# Patient Record
Sex: Female | Born: 1935 | Race: White | Hispanic: No | Marital: Married | Smoking: Never smoker
Health system: Southern US, Community
[De-identification: ages and names within clinical notes are randomized; demographics above are authoritative.]

## PROBLEM LIST (undated history)

## (undated) DIAGNOSIS — S0990XA Unspecified injury of head, initial encounter: Secondary | ICD-10-CM

## (undated) DIAGNOSIS — I471 Supraventricular tachycardia: Secondary | ICD-10-CM

## (undated) DIAGNOSIS — R51 Headache: Secondary | ICD-10-CM

## (undated) DIAGNOSIS — M8580 Other specified disorders of bone density and structure, unspecified site: Secondary | ICD-10-CM

## (undated) DIAGNOSIS — K219 Gastro-esophageal reflux disease without esophagitis: Secondary | ICD-10-CM

## (undated) DIAGNOSIS — M47812 Spondylosis without myelopathy or radiculopathy, cervical region: Secondary | ICD-10-CM

## (undated) DIAGNOSIS — K5790 Diverticulosis of intestine, part unspecified, without perforation or abscess without bleeding: Secondary | ICD-10-CM

## (undated) DIAGNOSIS — C4491 Basal cell carcinoma of skin, unspecified: Secondary | ICD-10-CM

## (undated) HISTORY — DX: Unspecified injury of head, initial encounter: S09.90XA

## (undated) HISTORY — DX: Gastro-esophageal reflux disease without esophagitis: K21.9

## (undated) HISTORY — DX: Other specified disorders of bone density and structure, unspecified site: M85.80

## (undated) HISTORY — DX: Supraventricular tachycardia: I47.1

## (undated) HISTORY — PX: NISSEN FUNDOPLICATION: SHX2091

## (undated) HISTORY — DX: Spondylosis without myelopathy or radiculopathy, cervical region: M47.812

## (undated) HISTORY — DX: Basal cell carcinoma of skin, unspecified: C44.91

## (undated) HISTORY — PX: ABDOMINAL HYSTERECTOMY: SHX81

## (undated) HISTORY — PX: APPENDECTOMY: SHX54

## (undated) HISTORY — PX: COLONOSCOPY: SHX174

## (undated) HISTORY — DX: Headache: R51

## (undated) HISTORY — DX: Diverticulosis of intestine, part unspecified, without perforation or abscess without bleeding: K57.90

## (undated) HISTORY — PX: TONSILLECTOMY: SUR1361

---

## 2001-06-24 DIAGNOSIS — I471 Supraventricular tachycardia, unspecified: Secondary | ICD-10-CM

## 2001-06-24 HISTORY — DX: Supraventricular tachycardia, unspecified: I47.10

## 2001-06-24 HISTORY — DX: Supraventricular tachycardia: I47.1

## 2001-07-13 ENCOUNTER — Ambulatory Visit (HOSPITAL_COMMUNITY): Admission: RE | Admit: 2001-07-13 | Discharge: 2001-07-13 | Payer: Self-pay | Admitting: Internal Medicine

## 2001-07-13 ENCOUNTER — Encounter: Payer: Self-pay | Admitting: Internal Medicine

## 2001-09-12 ENCOUNTER — Inpatient Hospital Stay (HOSPITAL_COMMUNITY): Admission: EM | Admit: 2001-09-12 | Discharge: 2001-09-15 | Payer: Self-pay | Admitting: Cardiology

## 2001-09-12 ENCOUNTER — Encounter: Payer: Self-pay | Admitting: Emergency Medicine

## 2001-10-18 ENCOUNTER — Emergency Department (HOSPITAL_COMMUNITY): Admission: EM | Admit: 2001-10-18 | Discharge: 2001-10-18 | Payer: Self-pay | Admitting: Internal Medicine

## 2001-10-18 ENCOUNTER — Encounter: Payer: Self-pay | Admitting: Internal Medicine

## 2001-11-27 ENCOUNTER — Observation Stay (HOSPITAL_COMMUNITY): Admission: EM | Admit: 2001-11-27 | Discharge: 2001-11-28 | Payer: Self-pay | Admitting: Emergency Medicine

## 2001-11-27 ENCOUNTER — Encounter: Payer: Self-pay | Admitting: Emergency Medicine

## 2001-12-18 ENCOUNTER — Ambulatory Visit (HOSPITAL_COMMUNITY): Admission: RE | Admit: 2001-12-18 | Discharge: 2001-12-18 | Payer: Self-pay | Admitting: Internal Medicine

## 2002-05-05 ENCOUNTER — Ambulatory Visit (HOSPITAL_COMMUNITY): Admission: RE | Admit: 2002-05-05 | Discharge: 2002-05-05 | Payer: Self-pay | Admitting: Internal Medicine

## 2002-05-05 ENCOUNTER — Encounter: Payer: Self-pay | Admitting: Internal Medicine

## 2003-02-24 ENCOUNTER — Encounter: Payer: Self-pay | Admitting: Internal Medicine

## 2003-02-24 ENCOUNTER — Ambulatory Visit (HOSPITAL_COMMUNITY): Admission: RE | Admit: 2003-02-24 | Discharge: 2003-02-24 | Payer: Self-pay | Admitting: Internal Medicine

## 2003-06-27 ENCOUNTER — Emergency Department (HOSPITAL_COMMUNITY): Admission: EM | Admit: 2003-06-27 | Discharge: 2003-06-27 | Payer: Self-pay | Admitting: Emergency Medicine

## 2004-04-23 ENCOUNTER — Other Ambulatory Visit: Admission: RE | Admit: 2004-04-23 | Discharge: 2004-04-23 | Payer: Self-pay | Admitting: Dermatology

## 2004-04-28 ENCOUNTER — Inpatient Hospital Stay (HOSPITAL_COMMUNITY): Admission: EM | Admit: 2004-04-28 | Discharge: 2004-04-30 | Payer: Self-pay | Admitting: Emergency Medicine

## 2004-05-01 ENCOUNTER — Emergency Department (HOSPITAL_COMMUNITY): Admission: EM | Admit: 2004-05-01 | Discharge: 2004-05-01 | Payer: Self-pay | Admitting: Emergency Medicine

## 2004-05-03 ENCOUNTER — Ambulatory Visit (HOSPITAL_COMMUNITY): Admission: RE | Admit: 2004-05-03 | Discharge: 2004-05-03 | Payer: Self-pay | Admitting: General Surgery

## 2004-08-10 ENCOUNTER — Observation Stay (HOSPITAL_COMMUNITY): Admission: EM | Admit: 2004-08-10 | Discharge: 2004-08-11 | Payer: Self-pay | Admitting: Emergency Medicine

## 2004-08-10 ENCOUNTER — Ambulatory Visit: Payer: Self-pay | Admitting: *Deleted

## 2004-08-10 LAB — CONVERTED CEMR LAB
BUN: 14 mg/dL
CO2: 26 meq/L
Chloride: 100 meq/L
Glucose, Bld: 85 mg/dL
HCT: 34 %
Hemoglobin: 12 g/dL
Platelets: 218 10*3/uL
Potassium: 3.4 meq/L
Sodium: 134 meq/L
Total Protein: 5.9 g/dL
WBC: 5.8 10*3/uL

## 2004-08-15 ENCOUNTER — Ambulatory Visit (HOSPITAL_COMMUNITY): Admission: RE | Admit: 2004-08-15 | Discharge: 2004-08-15 | Payer: Self-pay | Admitting: Internal Medicine

## 2004-10-09 ENCOUNTER — Ambulatory Visit: Payer: Self-pay | Admitting: Internal Medicine

## 2004-10-09 ENCOUNTER — Ambulatory Visit (HOSPITAL_COMMUNITY): Admission: RE | Admit: 2004-10-09 | Discharge: 2004-10-09 | Payer: Self-pay | Admitting: Internal Medicine

## 2005-06-28 ENCOUNTER — Ambulatory Visit (HOSPITAL_COMMUNITY): Admission: RE | Admit: 2005-06-28 | Discharge: 2005-06-28 | Payer: Self-pay | Admitting: Internal Medicine

## 2005-08-19 ENCOUNTER — Ambulatory Visit (HOSPITAL_COMMUNITY): Admission: RE | Admit: 2005-08-19 | Discharge: 2005-08-19 | Payer: Self-pay | Admitting: Internal Medicine

## 2009-09-11 LAB — CONVERTED CEMR LAB
ALT: 13 units/L
AST: 17 units/L
Albumin: 4.4 g/dL
Alkaline Phosphatase: 67 units/L
BUN: 13 mg/dL
CO2: 28 meq/L
Calcium: 8.7 mg/dL
Chloride: 103 meq/L
Creatinine, Ser: 0.79 mg/dL
Glucose, Bld: 87 mg/dL
Potassium: 4.1 meq/L
Sodium: 141 meq/L
TSH: 1.04 microintl units/mL
Total Bilirubin: 0.6 mg/dL
Total Protein: 6.9 g/dL

## 2010-07-05 LAB — CONVERTED CEMR LAB
BUN: 17 mg/dL
CO2: 28 meq/L
Chloride: 103 meq/L
Creatinine, Ser: 0.81 mg/dL
Glucose, Bld: 99 mg/dL
Potassium: 3.8 meq/L
Sodium: 140 meq/L

## 2010-07-12 ENCOUNTER — Ambulatory Visit (HOSPITAL_COMMUNITY)
Admission: RE | Admit: 2010-07-12 | Discharge: 2010-07-12 | Payer: Self-pay | Source: Home / Self Care | Attending: Internal Medicine | Admitting: Internal Medicine

## 2010-07-14 ENCOUNTER — Emergency Department (HOSPITAL_COMMUNITY)
Admission: EM | Admit: 2010-07-14 | Discharge: 2010-07-14 | Payer: Self-pay | Source: Home / Self Care | Admitting: Emergency Medicine

## 2010-07-14 LAB — CONVERTED CEMR LAB
HCT: 38.5 %
Hemoglobin: 13.7 g/dL
Platelets: 230 10*3/uL
WBC: 7.3 10*3/uL

## 2010-07-16 ENCOUNTER — Encounter: Payer: Self-pay | Admitting: *Deleted

## 2010-07-17 LAB — DIFFERENTIAL
Basophils Absolute: 0 10*3/uL (ref 0.0–0.1)
Basophils Relative: 1 % (ref 0–1)
Eosinophils Absolute: 0.2 10*3/uL (ref 0.0–0.7)
Eosinophils Relative: 2 % (ref 0–5)
Lymphocytes Relative: 24 % (ref 12–46)
Lymphs Abs: 1.8 10*3/uL (ref 0.7–4.0)
Monocytes Absolute: 0.8 10*3/uL (ref 0.1–1.0)
Neutro Abs: 4.5 10*3/uL (ref 1.7–7.7)
Neutrophils Relative %: 62 % (ref 43–77)

## 2010-07-17 LAB — BASIC METABOLIC PANEL
BUN: 17 mg/dL (ref 6–23)
CO2: 28 mEq/L (ref 19–32)
Calcium: 9.5 mg/dL (ref 8.4–10.5)
Chloride: 103 mEq/L (ref 96–112)
Creatinine, Ser: 0.81 mg/dL (ref 0.4–1.2)
GFR calc Af Amer: >60 mL/min (ref >60)
GFR calc non Af Amer: >60 mL/min (ref >60)
Glucose, Bld: 99 mg/dL (ref 70–99)
Sodium: 140 mEq/L (ref 135–145)

## 2010-07-17 LAB — CBC
HCT: 38.5 % (ref 36.0–46.0)
Hemoglobin: 13.7 g/dL (ref 12.0–15.0)
WBC: 7.3 10*3/uL (ref 4.0–10.5)

## 2010-07-17 LAB — POCT CARDIAC MARKERS
CKMB, poc: 1.3 ng/mL (ref 1.0–8.0)
Myoglobin, poc: 43.4 ng/mL (ref 12–200)

## 2010-07-19 ENCOUNTER — Ambulatory Visit
Admission: RE | Admit: 2010-07-19 | Discharge: 2010-07-19 | Payer: Self-pay | Source: Home / Self Care | Attending: Cardiology | Admitting: Cardiology

## 2010-07-19 ENCOUNTER — Encounter (INDEPENDENT_AMBULATORY_CARE_PROVIDER_SITE_OTHER): Payer: Self-pay | Admitting: *Deleted

## 2010-07-19 DIAGNOSIS — R079 Chest pain, unspecified: Secondary | ICD-10-CM | POA: Insufficient documentation

## 2010-07-19 DIAGNOSIS — K219 Gastro-esophageal reflux disease without esophagitis: Secondary | ICD-10-CM | POA: Insufficient documentation

## 2010-07-26 NOTE — Letter (Signed)
Summary: Stress Echocardiogram Information Sheet  [facility] at [facility]  [address], [place] [address]   Phone: [phone]  Fax: [phone]      July 19, 2010 MRN: [id number] light prior to the test.   [name]  Doctor: Appointment Date: Appointment Time: Appointment Location: [facility]  Stress Echocardiogram Information Sheet    Instructions:   1. DO NOT  take your am medicine morning before the test.  2. Nothing to eat or drink after midnight  3. Dress prepared to exercise.  4. DO NOT use ANY caffine or tobacco products 3 hours before appointment.  5. Report to the Short Stay Center on the1st floor.  6. Please bring all current prescription medications.  7. If you have any questions, please call [phone]

## 2010-07-31 ENCOUNTER — Encounter (HOSPITAL_COMMUNITY): Payer: Self-pay

## 2010-07-31 ENCOUNTER — Ambulatory Visit (HOSPITAL_COMMUNITY): Payer: MEDICARE

## 2010-08-01 NOTE — Assessment & Plan Note (Signed)
Summary: ***ER DR.FAGAN FOR CHEST PAIN/JAW PAIN/NEW T WAVE CHANGES ON ...   Visit Type:  Initial Consult Primary Provider:  Dr. Carylon Perches   History of Present Illness: [name] is seen at the kind request of Dr. Ouida Sills for an initial visit for evaluation of chest discomfort.  This nice woman, diagnosed with right shoulder bursitis in the past, has noted less intense recurrent pain in recent weeks.  This has been associated with tightness in the right chest, but no dyspnea, diaphoresis or nausea.  There is no relationship to meals, movement, exertion or body position.  Mobility of the right shoulder remains fairly good and far better than during her previous episode.  Radiation of discomfort to the right neck and jaw has occurred intermittently.    Prior records were obtained from Dr. Ouida Sills and from [facility] and reviewed.  A CT scan of the cervical spine from a few years ago demonstrated substantial degenerative disease.  Patient has no history of tobacco use, diabetes, hypertension or hyperlipidemia.  There is no strong family history for coronary disease.  She has derived some benefit from use of over-the-counter nonsteroidals.  [name] first came to the attention of our practice 10 years ago when she presented with PSVT.  Radiofrequency ablation performed by Dr. Graciela Husbands has apparently been curative.  Cardiac catheterization performed in 2003 for chest discomfort associated with her supraventricular tachycardia revealed no abnormalities.  MRI Brain  Procedure date:  08/19/2005  Findings:       Findings:  Ventricles are normal in size.  Small chronic lacunar   infarctions are noted in the basal ganglia and right thalamus.  No   significant white matter infarcts are seen.  Diffusion weighted   imaging is negative for acute infarct.  Negative for mass or fluid   collection.   IMPRESSION:   Mild chronic ischemic changes are noted.  No acute abnormality.  CT Scan  Procedure  date:  08/15/2004  Findings:      CT of Cervical Spine   IMPRESSION:   Multilevel degenerative disc disease changes at C4-C5, C5-C6, and   C6-C7.   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   Right neural foraminal stenosis at C5-C6 without definite C6 root   compression.   No disc herniation at any level.   No definite cause for radicular symptoms to left upper extremity.   PFT  Procedure date:  08/15/2004  Findings:      No definite ventilatory defect other than mild small airway obstruction  EKG  Procedure date:  07/19/2010  Findings:      Normal sinus rhythm Within normal limits No significant change when compared to a previous tracing of 07/10/2008  -  Date:  07/14/2010    WBC: 7.3    HGB: 13.7    HCT: 38.5    PLT: 230    MCV: 88    CPK: MB- 1.3  Date:  07/05/2010    BG Random: 99    BUN: 17    Creatinine: 0.81    Sodium: 140    Potassium: 3.8    Chloride: 103    CO2 Total: 28  Date:  09/11/2009    BG Random: 87    BUN: 13    Creatinine: 0.79    Sodium: 141    Potassium: 4.1    Chloride: 103    CO2 Total: 28    Total Protein: 6.9  Albumin: 4.4    SGOT (AST): 17    SGPT (ALT): 13    T. Bilirubin: 0.6    Alk Phos: 67    Calcium: 8.7    TSH: 1.04  Date:  08/10/2004    WBC: 5.8    HGB: 12    HCT: 34    PLT: 218    BG Random: 85    BUN: 14    Creatinine: 0.8    Sodium: 134    Potassium: 3.4    Chloride: 100    CO2 Total: 26    Total Protein: 5.9    Albumin: 3.7   Current Medications (verified): 1)  Aleve 220 Mg Tabs (Naproxen Sodium) .... Take As Needed For Shoulder Pain 2)  Naprosyn 250 Mg Tabs (Naproxen) .... Take 1-2 Tablets By Mouth Two Times A Day  Allergies (verified): 1)  ! Morphine 2)  ! Sulfa 3)  ! Penicillin 4)  ! Vancomycin  Past History:  Family History: Last updated: 08-07-10 Mother-deceased; rheumatoid arthritis and vasculitis Father-deceased at advanced age  due to coronary disease; history of Parkinson's disease Siblings: one of 2 brothers died in childhood as a result of renal failure; one sister is alive with rheumatoid arthritis; a second sister died as a result of asthma and cardiac issues  Social History: Last updated: Aug 07, 2010 Married with 4 children Occupation-[employer] Tobacco Use - No.  Alcohol Use - no Regular Exercise - no  Past Medical History: PSVT (radiofrequency ablation 2003) Chest pain: negative coronary angiography in 2003;2006-readmission; normal echo Gastroesophageal reflux disease; hiatal hernia Pneumothorax secondary to trauma-2006 Degenerative joint disease-cervical spine Closed head injury secondary to fall in 2008 Diverticulosis Osteopenia Headaches Basal cell carcinomas  Past Surgical History: Tonsillectomy Appendectomy Hysterectomy Nissen fundoplication complicated by esophageal perforation Colonoscopy: 09/2004-hemorrhoids; sigmoid diverticula; single small polyp; cold biopsy/excision  Family History: Mother-deceased; rheumatoid arthritis and vasculitis Father-deceased at advanced age due to coronary disease; history of Parkinson's disease Siblings: one of 2 brothers died in childhood as a result of renal failure; one sister is alive with rheumatoid arthritis; a second sister died as a result of asthma and cardiac issues  Social History: Married with 4 children Occupation-[employer] Tobacco Use - No.  Alcohol Use - no Regular Exercise - no  Review of Systems       Patient notes continued issues with balance and headache which she attributes to a closed head injury 3 years ago; corrective lenses required; continues to have mild palpitations, but nothing like her initial supraventricular tachycardia; urinary frequency and nocturia.  All other systems reviewed and are negative.  Vital Signs:  Patient profile:   76 year old female Height:      63 inches Weight:      122 pounds BMI:     21.69 O2 Sat:       94 % on Room air Pulse rate:   72 / minute BP sitting:   150 / 78  (left arm)  Vitals Entered By: Dreama Saa, CNA (08-07-2010 1:58 PM)  O2 Flow:  Room air  Physical Exam  General:  proportionate weight and height; anxious; well-developed; no acute distress HEENT-[place]/AT; PERRL; EOM intact; conjunctiva and lids nl:  Neck-No JVD; no carotid bruits: Endocrine-borderline thyromegaly; the gland is smooth and symmetric Lungs-No tachypnea, clear without rales, rhonchi or wheezes: CV-normal PMI; normal S1 and S2; fourth heart sound present Abdomen-BS normal; soft and non-tender without masses or organomegaly: MS-No deformities, cyanosis or clubbing: Neurologic-Nl cranial nerves; symmetric strength  and tone: Skin- Warm, no sig. lesions: Extremities-Nl distal pulses; no edema    Impression & Recommendations:  Problem # 1:  CHEST PAIN (ICD-786.50) Symptoms are for the most part atypical for coronary disease in that chest tightness is virtually constant and unrelated to activity.  Patient's risk for coronary disease is low; however, the radiation of her discomfort to the jaw is of some concern.  While the etiology of her discomfort is likely musculoskeletal, related either to right shoulder problems or cervical spine disease, we will proceed with a stress echocardiogram to rule out myocardial ischemia.  She has derived significant benefit from treatment with a nonsteroidal and will continue to take Naprosyn at a dose of 200-400 mg b.i.d. or t.i.d.  I will reassess this nice woman's symptoms at return office visit in one month.  Other Orders: Stress Echo (Stress Echo)  Patient Instructions: 1)  Your physician recommends that you schedule a follow-up appointment in: 1 month 2)  Your physician has recommended you make the following change in your medication:NAPROSYN 250MG   TAKE 1-2 TABLETS BY MOUTH TWICE DAILY 3)  Your physician has requested that you have a stress echocardiogram. For  further information please visit [email or web address]  Please follow instruction sheet as given.

## 2010-08-09 ENCOUNTER — Ambulatory Visit (HOSPITAL_COMMUNITY)
Admission: RE | Admit: 2010-08-09 | Discharge: 2010-08-09 | Disposition: A | Discharge status: Home / Self Care | Payer: MEDICARE | Source: Ambulatory Visit | Attending: Cardiology | Admitting: Cardiology

## 2010-08-09 ENCOUNTER — Encounter: Payer: Self-pay | Admitting: Cardiology

## 2010-08-09 ENCOUNTER — Ambulatory Visit (HOSPITAL_COMMUNITY): Payer: MEDICARE | Attending: Cardiology

## 2010-08-09 DIAGNOSIS — R079 Chest pain, unspecified: Secondary | ICD-10-CM | POA: Insufficient documentation

## 2010-08-09 DIAGNOSIS — R072 Precordial pain: Secondary | ICD-10-CM

## 2010-08-09 DIAGNOSIS — R0789 Other chest pain: Secondary | ICD-10-CM | POA: Insufficient documentation

## 2010-08-21 ENCOUNTER — Encounter (INDEPENDENT_AMBULATORY_CARE_PROVIDER_SITE_OTHER): Payer: Self-pay | Admitting: *Deleted

## 2010-08-22 ENCOUNTER — Ambulatory Visit (INDEPENDENT_AMBULATORY_CARE_PROVIDER_SITE_OTHER): Payer: MEDICARE | Admitting: Cardiology

## 2010-08-22 ENCOUNTER — Encounter: Payer: Self-pay | Admitting: Cardiology

## 2010-08-22 DIAGNOSIS — R079 Chest pain, unspecified: Secondary | ICD-10-CM

## 2010-08-24 ENCOUNTER — Encounter (INDEPENDENT_AMBULATORY_CARE_PROVIDER_SITE_OTHER): Payer: Self-pay | Admitting: *Deleted

## 2010-08-30 NOTE — Miscellaneous (Signed)
Summary: stress echo 08/09/2010  Clinical Lists Changes  Observations: Added new observation of ECHOINTERP:  CONCLUSION:   1. Normal exercise treadmill test at a maximum workload of 7 mets.  No       chest pain was reported.  Peak blood pressure 160/88, and no       diagnostic ST-segment changes noted by standard criteria.  No       inducible arrhythmias were noted.   2. No inducible wall motion abnormalities noted to indicate ischemia.   3. Possible small epicardial fat pad anteriorly versus small       pericardial effusion noted only on the parasternal images at peak.       This could likely be better assessed by a  complete resting 2-D       echocardiographic study if of clinical relevance.               Jonelle Sidle, MD     (08/09/2010 15:39)      Echocardiogram  Procedure date:  08/09/2010  Findings:       CONCLUSION:   1. Normal exercise treadmill test at a maximum workload of 7 mets.  No       chest pain was reported.  Peak blood pressure 160/88, and no       diagnostic ST-segment changes noted by standard criteria.  No       inducible arrhythmias were noted.   2. No inducible wall motion abnormalities noted to indicate ischemia.   3. Possible small epicardial fat pad anteriorly versus small       pericardial effusion noted only on the parasternal images at peak.       This could likely be better assessed by a  complete resting 2-D       echocardiographic study if of clinical relevance.               Jonelle Sidle, MD

## 2010-08-30 NOTE — Letter (Signed)
Summary: Generic Letter  [facility] at [place]  [address] [place] [address]   Phone: [phone]  Fax: [phone]        August 24, 2010 MRN: [id number]    [name] [address] [facility] [address] [place], [place]  [address]    Dear Ms. Ladd,  This is an order for a soft cervical collar.  I attempted to call you all day on Friday  August 24, 2010, without success.  Enclosed is the order for the collar.  You may go to [employer] or any area drug store to get your soft collar for your neck.         Sincerely,  Teressa Lower RN  This letter has been electronically signed by your physician.

## 2010-09-11 NOTE — Assessment & Plan Note (Signed)
Summary: 1 mth f/u per checkout on 07/19/10/tg   Primary Provider:  Dr. Carylon Perches   History of Present Illness: [name] returns to the office as scheduled for continuing assessment of chest discomfort.  Since her last visit, she has done generally well.  She continues to experience discomfort in her neck, right shoulder and right upper chest, but this is substantially improved.  Symptoms are exacerbated by long periods of maintaining her neck in the same position or by movement of her neck and relieved with nonsteroidals.  She is taking Aleve on a less than daily basis with good effect.  She is concerned about the possibility of doing further damage without additional imaging studies and therapy.  Patient denies paresthesias in the upper extremities, weakness or change in bowel or bladder habit.  Stress echocardiogram showed acceptable exercise tolerance with neither electrocardiographic nor echocardiographic evidence for ischemia or infarction.  Current Medications (verified): 1)  Aleve 220 Mg Tabs (Naproxen Sodium) .... Take As Needed For Shoulder Pain  Allergies (verified): 1)  ! Morphine 2)  ! Sulfa 3)  ! Penicillin 4)  ! Vancomycin  Past History:  PMH, FH, and Social History reviewed and updated.  Review of Systems       See history of present illness.  Vital Signs:  Patient profile:   76 year old female Weight:      124 pounds O2 Sat:      96 % on Room air Pulse rate:   73 / minute Pulse (ortho):   79 / minute BP sitting:   130 / 75  (left arm) BP standing:   124 / 74  Vitals Entered ByLarita Fife Via LPN (August 22, 2010 2:25 PM)  O2 Flow:  Room air  Serial Vital Signs/Assessments:  Time      Position  BP       Pulse  Resp  Temp     By 2:41 PM   Lying LA  135/77   71                    Lynn Via LPN 1:61 PM   Sitting   131/85   76                    Lynn Via LPN 0:96 PM   Standing  124/74   79                    Lynn Via LPN  Comments: 0:45 PM Pt. c/o  being lightheaded when sitting up from lying position and when standing from sitting position By: Larita Fife Via LPN    Physical Exam  General:  Trim, anxious woman in no acute distress Muscle strength and tone in the upper extremities is normal.  Reflexes are brisk.  No sensory defects noted. Lungs-clear Cardiac-normal S1 and S2; + S4   Impression & Recommendations:  Problem # 1:  CHEST PAIN (ICD-786.50) Symptoms are most compatible with a musculoskeletal etiology, most likely degenerative joint or disc disease of the cervical spine, and have responded to treatment for same.  I explained to her that additional evaluation and treatment would be warranted for the development of any neurologic signs or for uncontrolled pain.  She will report if either of these eventualities occur.  I will not plan a routine return visit, but will be happy to reassess this nice woman anytime that Dr. Ouida Sills deems appropriate.  Other Orders: Durable Medical Equipment (DME)  Patient Instructions: 1)  Your physician recommends that you schedule a follow-up appointment in: as needed  Prevention & Chronic Care Immunizations   Influenza vaccine: Not documented    Tetanus booster: Not documented    Pneumococcal vaccine: Not documented    H. zoster vaccine: Not documented  Colorectal Screening   Hemoccult: Not documented    Colonoscopy: Not documented  Other Screening   Pap smear: Not documented    Mammogram: Not documented    DXA bone density scan: Not documented   Smoking status: never  (07/16/2010)  Lipids   Total Cholesterol: Not documented   LDL: Not documented   LDL Direct: Not documented   HDL: Not documented   Triglycerides: Not documented

## 2010-11-09 NOTE — H&P (Signed)
[place]. [facility]  Patient:    [name], [name] Visit Number: [id number] MRN: [id number]          Service Type: EMS Location: ED Attending Physician:  Annamarie Dawley Dictated by:   Nathen May, M.D., [facility] Admit Date:  09/12/2001 Discharge Date: 09/12/2001   CC:         [name], M.D.  Electrophysiology Laboratory, [facility]  [facility], [facility]   History and Physical  HISTORY OF PRESENT ILLNESS: Thank you very much for asking me to see [name] in electrophysiology consultation for recurrent supraventricular tachycardia.  [name] is a 76 year old woman, mother of four, who has a three to five year history of recurrent abrupt onset of tachy palpitations, which have become increasingly frequent and symptomatic of late.  They are frog positive but diuretic negative.  They are associated with light headedness and more recently with chest pressure, but not shortness of breath.  They are accompanied by nausea and subsequently profound post episode weakness.  They occasionally awaken her from sleep.  She had another episode yesterday.  She was given adenosine with termination, but because of chest discomfort she was admitted to the hospital.  Her troponin has become elevated to a level of 0.15 and subsequently returned to normal.  CARDIAC RISK FACTORS: Negative for hypertension, cigarettes, diabetes, hypercholesterolemia, or family history.  PAST MEDICAL HISTORY:  1. Nissen fundoplication complicated by perforation.  2. Tonsillectomy.  3. Appendectomy.  REVIEW OF SYSTEMS: Otherwise broadly negative.  FAMILY HISTORY: Noncontributory.  CURRENT MEDICATIONS:  1. Heparin.  2. Aspirin.  ALLERGIES:  1. VANCOMYCIN.  2. SULFA.  SOCIAL HISTORY: She is married.  She has four children.  She is the president of [employer].  PHYSICAL EXAMINATION:  GENERAL: She is an elderly Caucasian female in no  acute distress.  VITAL SIGNS: Blood pressure 105/75, heart rate 76, respirations 14 and unlabored.  HEENT: No scleral icterus.  No xanthoma.  NECK: The neck veins were flat.  Carotids were brisk and full bilateral without lymphadenopathy.  BACK: Without kyphosis, scoliosis.  CHEST: Lungs clear.  CARDIAC: Heart sounds were regular without murmurs or gallops.  ABDOMEN: Soft with active bowel sounds.  There is a well-healed scar of her epigastrium.  No hepatomegaly.  There was a midline pulsation but it was not broad.  EXTREMITIES: Femoral pulses 2+, distal pulses intact.  No clubbing, cyanosis, or edema.  NEUROLOGIC: Grossly normal.  LABORATORY DATA: Electrocardiogram demonstrated sinus rhythm at 105 with interval of 0.13/0.08/0.31.  There is no evidence of preexcitation.  Electrocardiogram revealed tachycardia, demonstrated tachycardia at cycle length of 330 milliseconds.  There is an R prime in lead V1 suggestive of AV nodal re-entry.  There is also obliteration of Q waves in the inferior leads, also supportive of that diagnosis.  Prior catheterization in 1998 demonstrated normal coronary arteries with a prior abnormal stress test.  IMPRESSION:  1. Supraventricular tachycardia, probably atrioventricular nodal re-entry     with cycle length 340 milliseconds.  2. Chest pain associated with #1.  3. Positive troponins with #1.  4. Cardiac risk factors broadly negative.  5. Negative catheterization in 1998 with false/positive stress test.  6. Status post Nissen fundoplication.  PLAN: [name] has supraventricular tachycardia.  We discussed extensively therapeutic options including p.r.n. or daily beta-blocker/calcium blocker, the role of intermittent growth with potential for proarrhythmia and EP testing with RF catheter ablation with potential of 1:1000 for death, 1:100 risk  of iatrogenic heart block requiring pacemaker implantation, as well as the potential for  cardiac perforation and vascular injury.  Having reviewed these options she would like to defer decision until her coronary artery status is further clarified.  Based on her positive troponins, not withstanding her negative catheterization five years ago, but particularly because she had a false/positive Cardiolite at that time, I think catheterization is indicated. We have discussed this and she is agreeable and we will proceed.  For right now I would continue her on her current medications prior to making the decision as to treatment options for her supraventricular tachycardia. Dictated by:   Nathen May, M.D., [facility] Attending Physician:  Annamarie Dawley DD:  09/13/01 TD:  09/14/01 Job: [id number]

## 2010-11-09 NOTE — Discharge Summary (Signed)
[place]. [facility]  Patient:    [name], [name] Visit Number: [id number] MRN: [id number]          Service Type: MED Location: 2000 2022 01 Attending Physician:  Talitha Givens Dictated by:   Chinita Pester, C.R.N.P. Admit Date:  09/12/2001 Discharge Date: 09/15/2001                             Discharge Summary  ADDENDUM  DISCHARGE MEDICATIONS:  After discussion with Dr. Graciela Husbands, the patient was given a prescription for Inderal 10 mg p.r.n. for tachypalpitations and also instructed to take a baby coated aspirin daily. Dictated by:   Chinita Pester, C.R.N.P. Attending Physician:  Talitha Givens DD:  09/15/01 TD:  09/16/01 Job: [id number]

## 2010-11-09 NOTE — Cardiovascular Report (Signed)
[place]. [facility]  Patient:    [name], [name] Visit Number: [id number] MRN: [id number]          Service Type: MED Location: 2000 2022 01 Attending Physician:  Talitha Givens Dictated by:   Lewayne Bunting, M.D. [facility] Proc. Date: 09/14/01 Admit Date:  09/12/2001 Discharge Date: 09/15/2001   CC:         Dr. Chelsea Primus, M.D. [facility]  Nathen May, M.D. [facility]   Cardiac Catheterization  DATE OF BIRTH: October 11, 1944  REFERRING PHYSICIAN: Dr. Ouida Sills  CARDIOLOGIST: Dietrich Pates, M.D.  ELECTROPHYSIOLOGIST: Nathen May, M.D.  PROCEDURES PERFORMED: 1. Left heart catheterization with selective coronary angiography. 2. Ventriculography. 3. Distal aortography.  DIAGNOSES: 1. No significant flow-limiting epicardial coronary artery disease. 2. Normal left ventricular systolic function. 3. No abdominal aortic aneurysm.  INDICATIONS: The patient is a 76 year old female, admitted with supraventricular tachycardia. The patient, during episode of rapid heart rhythm ruled in for possibly a non-Q-wave myocardial infarction with only mildly elevated troponins, albeit with normal CK and CK-MB values. In anticipation of a radiofrequency ablation, Dr. Graciela Husbands has requested a diagnostic catheterization to assess the patients coronary anatomy. Of note, the patient had a prior cardiac catheterization in 1998, which showed no evidence of flow-limiting coronary artery disease.  DESCRIPTION OF PROCEDURE: After informed consent was obtained, the patient was brought to the catheterization laboratory.  The right groin was sterilely prepped and draped.  Lidocaine 1% was infiltrated. A 6 French arterial sheath was placed using modified Seldinger technique. Subsequently, a 6 [place] and JR4 catheters were used for selective coronary angiography. Selective coronary angiography was performed in various projections using manual injection of contrast.  Following this, an angled pigtail catheter was placed in the left ventricular cavity and ventriculography was performed in a single plane RAO projection. At the termination of the procedure, all catheters and sheath were removed and the patient was brought back to the holding area. Adequate hemostasis was provided. No complications were noted.  FINDINGS: 1. Hemodynamics: Left ventricular pressure 147/14 mmHg, aortic pressure 147/77    mmHg. 2. Ventriculography: Ejection fraction 70%. No segmental wall motion    abnormalities. No mitral regurgitation.  SELECTIVE CORONARY ANGIOGRAPHY: 1. Left main coronary is a moderate caliber vessel with no evidence of    flow-limiting disease. 2. LAD is a moderate to small caliber vessel with 20% tapering in the proximal    segment prior to a first diagonal branch which itself has a proximal    30% stenosis. Remainder of the LAD is free of flow-limiting coronary artery    disease. 3. Circumflex coronary artery is a moderate sized vessel with no evidence    of flow-limiting CAD. 4. The right coronary artery is a large caliber vessel, again with no evidence    of flow-limiting epicardial coronary artery disease.  RECOMMENDATIONS: The patient should continue on medical therapy. Risk factor modification remains in order with particular attention to a lipid profile. The patient will proceed with further evaluation for possible radiofrequency ablation for supraventricular tachycardia (AV and RT). Dictated by:   Lewayne Bunting, M.D. [facility] Attending Physician:  Talitha Givens DD:  09/14/01 TD:  09/15/01 Job: [id number]

## 2010-11-09 NOTE — Discharge Summary (Signed)
[facility]  Patient:    [name], [name] Visit Number: [id number] MRN: [id number]          Service Type: MED Location: 2A A216 01 Attending Physician:  Carylon Perches Dictated by:   Carylon Perches, M.D. Admit Date:  11/27/2001 Discharge Date: 11/28/2001                             Discharge Summary  DISCHARGE DIAGNOSES:  Supraventricular tachycardia.  HOSPITAL COURSE:  This patient is a 76 year old white female who presented to the emergency room with palpitations and tightness in the chest radiating to the back.  Her EKG revealed a supraventricular tachycardia with a rate of 176. She was treated with Adenosine and converted to normal sinus rhythm.  Her symptoms resolved.  She was hospitalized for observation overnight.  Her CPK was initially elevated at 283 but her troponin was normal at 0.02.  Hemoglobin was 14.6.  BUN and creatinine were 12 and 0.8 with a serum potassium of 3.6. She had taken approximately four doses of Sudafed during the day prior to admission.  She is scheduled for an ablation on June 27.  She had previously undergone cardiac catheterization in March 2003 revealing normal LV function with a 20% LAD stenosis and a 30% first diagonal stenosis.  She has taken atenolol on a p.r.n. basis.  She remained in normal sinus rhythm and had no recurrent symptoms.  She was stable for discharge on the morning of June 7.  She will follow up with cardiology for her ablation on June 27.  DISCHARGE MEDICATIONS:  Atenolol 25 mg b.i.d. Dictated by:   Carylon Perches, M.D. Attending Physician:  Carylon Perches DD:  11/28/01 TD:  12/01/01 Job: [id number]

## 2010-11-09 NOTE — Procedures (Signed)
[name], [name] NO.:  [id number]   MEDICAL RECORD NO.:  [id number]          PATIENT TYPE:  INP   LOCATION:  A216                          FACILITY:  [facility]   PHYSICIAN:  Vida Roller, M.D.   DATE OF BIRTH:  1934-10-13   DATE OF PROCEDURE:  DATE OF DISCHARGE:                                  ECHOCARDIOGRAM   REFERRING PHYSICIAN:  Dr. Butler Denmark at hospitalist group at [facility].   Tape #[id number].  Tape count 4039 through 4560.   This is a woman with chest discomfort.  Technical quality of the study is  adequate.  M-Mode tracings:  The aorta is 31 mm.   The left atrium is 32 mm.   The septum is 10 mm.   Posterior wall is 10 mm.   Left ventricular end-diastolic dimension is 37 mm.  Left ventricular  systolic dimension is 22 mm.   2D and Doppler Imaging:  The left ventricle is normal size with normal  systolic function.  There are no wall motion abnormalities seen.  The  estimated ejection fraction is 65-70%.  There is no left ventricular  hypertrophy.   The right ventricle is top normal in size with normal systolic function, no  obvious wall motion abnormalities.   Both atria are normal in size.   There is no pericardial effusion.   The aortic valve is morphologically unremarkable with no stenosis or  regurgitation.   The mitral valve is morphologically unremarkable with no stenosis or  regurgitation.   Tricuspid valve has trace regurgitation.   Pulmonic valve not well seen.   The inferior vena cava is not well seen.   The ascending aorta appears to be normal in size.      JH/MEDQ  D:  08/10/2004  T:  08/10/2004  Job:  [id number]   cc:   Kingsley Callander. Ouida Sills, MD  [address]  [place]  [place] [address]  Fax: [phone]   Calvert Cantor, M.D.

## 2010-11-09 NOTE — Discharge Summary (Signed)
[facility]  Patient:    [name], SANTOYO Visit Number: [id number] MRN: [id number]          Service Type: MED Location: 2000 2022 01 Attending Physician:  Talitha Givens Dictated by:   Chinita Pester, N.P. Admit Date:  09/12/2001 Discharge Date: 09/15/2001   CC:         Carylon Perches, M.D.  [facility]   Discharge Summary  PRIMARY DIAGNOSIS:  Tachy palpitations.  HISTORY OF PRESENT ILLNESS:  This is a 76 year old female mother of four with three to five year history of recurrent abrupt onset of tachy palpitations which have become increasingly frequent and symptomatic.  They are frog positive but diuretic negative, associated with lightheadedness and recently chest pressure but no shortness of breath.  Accompanied by nausea, subsequently profound, post episode weakness, occasionally awakened her from her sleep. She had another episode the day prior to admission. She was given adenosine with termination.  Because of the chest discomfort, she was admitted to the hospital.  Her troponin has become elevated to a level of 0.15 and subsequently returned to normal.  The patient was admitted with supraventricular tachycardia and underwent a cardiac catheterization on September 14, 2001.  Cardiac catheterization showed an EF of 70%, LAD of 20% diffuse proximal and proximal 30% diagonal I. She was to be medically managed and scheduled as an outpatient for an EP study of radiofrequency ablation. The patient was discharged to home on the following medications.  DISCHARGE MEDICATIONS:  All of her previous medications as before.  ACTIVITY:  She was instructed not to do any heavy lifting or strenuous activity for the next four days, no driving for two days.  DIET:  Low fat, low salt, low cholesterol diet.  WOUND CARE:  She was allowed to shower. She was to call if she developed a lump in her groin.  FOLLOW-UP:  An appointment was  scheduled for physicians assistant at [facility] on September 30, 2001, at 3 oclock.  She was to call Namon Cirri at [facility] to schedule an appointment for an ablation. Dictated by:   Chinita Pester, N.P. Attending Physician:  Talitha Givens DD:  09/15/01 TD:  09/16/01 Job: [id number]

## 2010-11-09 NOTE — Op Note (Signed)
NAME:  [name], [name]               ACCOUNT NO.:  [id number]   MEDICAL RECORD NO.:  [id number]          PATIENT TYPE:  AMB   LOCATION:  DAY                           FACILITY:  [facility]   PHYSICIAN:  R. Roetta Sessions, M.D. DATE OF BIRTH:  [birth year]   DATE OF PROCEDURE:  10/09/2004  DATE OF DISCHARGE:                                 OPERATIVE REPORT   PROCEDURE:  Screening colonoscopy with biopsy.   INDICATIONS FOR PROCEDURE:  Patient is a 76 year old lady who comes for  colorectal cancer screening.  She is referred by Dr. Ouida Sills.  She has no  lower GI tract symptoms.  No family history of colorectal neoplasia.  She  never had a colonoscopy.  The colonoscopy is now being done as a standard  screening maneuver.  This approach has been discussed with the patient at  length.  Potential risks, benefits and alternatives have been reviewed, and  questions answered.  She is agreeable.  Please see documentation on the  medical record.   PROCEDURE:  O2 saturation, blood pressure, pulses, and respirations were  monitored throughout the entire procedure.  Conscious sedation with Versed 4  mg IV, Demerol 75 mg IV in divided doses.   INSTRUMENT:  Olympus video chip system.   FINDINGS:  Digital rectal exam revealed no abnormalities.   ENDOSCOPIC FINDINGS:  Prep was good in the rectum and colon.  Examination of  the rectal mucosa with fully retroflexed view of the anal verge revealed no  abnormalities.   COLON:  The colonic mucosa was surveyed from the rectosigmoid junction to  the left transverse right colon to the appendiceal orifice to the ileocecal  valve and cecum, where the structures are well seen and photographed for the  record.  From this level, the scope was slowly withdrawn.  The previously  mentioned mucosal surfaces were again seen.  The patient had two 3 mm  diminutive polyps at 25 cm, which were cold-biopsied/removed.  The patient  also had left-sided diverticula.  The remainder of  the colonic mucosa  appeared normal.  The patient tolerated the procedure well and was reactive  in endoscopy.   IMPRESSION:  1.  Internal hemorrhoids, otherwise normal rectum.  2.  Sigmoid diverticula.  Diminutive polyps at 25 cm.  Cold biopsy/removed.      Left-sided diverticula.  The remainder of the colonic mucosa appeared      normal.  The patient tolerated the procedure well and was reactive.   RECOMMENDATIONS:  1.  Diverticulosis literature provided to Ms. Chiappetta.  2.  Follow up on path.  3.  Further recommendations to follow.      RMR/MEDQ  D:  10/09/2004  T:  10/09/2004  Job:  [id number]   cc:   Kingsley Callander. Ouida Sills, MD  [address]  [place]  [place] [address]  Fax: [phone]

## 2010-11-09 NOTE — H&P (Signed)
[name], [name]               ACCOUNT NO.:  [id number]   MEDICAL RECORD NO.:  [id number]          PATIENT TYPE:  INP   LOCATION:  A215                          FACILITY:  [facility]   PHYSICIAN:  Dirk Dress. Katrinka Blazing, M.D.   DATE OF BIRTH:  1935-01-15   DATE OF ADMISSION:  04/28/2004  DATE OF DISCHARGE:  LH                                HISTORY & PHYSICAL   A 77 year old female admitted for observation and treatment of a traumatic  pneumothorax.   HISTORY:  The patient states that she was helping her daughter clean around  a flow bed at her house today, when she tried to lift some pine needles off  of a truck.  Evidently she lost her balance while lifting the pine needles  and fell backwards striking her head against gravel and her chest against  the ground.  She states that the ground was soft.  There was no pavement or  concrete around this area.  She states that she had immediate chest pain and  head pain and shortness of breath.  When she tried to arise, she had some  nausea.  She was transported to the hospital by her son-in-law, and her  daughter by private vehicle.  She had chest pain and nausea with gagging on  the way to the hospital.  On evaluation, she was noted to have apical  pneumothorax on the right without clearly demonstrated rib fractures, but  she has exquisite pain.  She has a headache but this seems to be improving.  She also has a contusion of her elbow.  Since the pneumothorax is very small  and since she is asymptomatic otherwise it is elected to observe her to see  if this will resolve spontaneously over a couple of days.  If it should  worsen, we will place a chest tube, but if it does not worsen it is felt  that after 48 hours she probably can be safely allowed to go home.  There is  no evidence of hemothorax at this time.   PAST HISTORY:  1.  The patient has been relatively healthy for the past few years.  2.  She has a history of recurrent SVT that was  treated by radiofrequency      catheter ablation in June 2003.  She has been asymptomatic off      medications since that time.  3.  There is a history of open Nissen fundoplication over 30 years ago at      [facility].  This was complicated by an esophageal perforation      with what appears to be peritonitis and mediastinitis.  She had      bilateral chest tube drainage, open packing of her abdomen with healing      by second intention over a prolonged period of time.  She states that      she was treated with duodenal feedings for greater than six months at      home.  4.  She has had a coronary artery angiography which shows no major disease.  There is a 20% proximal LAD lesion and a 30% proximal first diagonal      branch lesion.  There is no evidence of disease in the circumflex or the      right coronary.  This was done in March 2003.  The left main coronary      showed no evidence of disease.  Ejection fraction was 70%.  She has no      wall abnormalities.   Other surgery in addition to her abdominal surgery includes:  1.  Hysterectomy.  2.  Tonsillectomy.   ALLERGIES:  1.  VANCOMYCIN.  2.  PENICILLIN.  3.  SULFA.  4.  MORPHINE.  On close questioning, these are probably not allergies but she has nausea,  mental confusion.   SOCIAL HISTORY:  She is retired, married.  She does not drink, smoke, or use  any drugs.   PHYSICAL EXAMINATION:  GENERAL:  She appears in mild distress due to pain.  She has no respiratory difficulty.  She is lying flat in bed on her left  side.  She complains of pain in her right chest.  VITAL SIGNS:  Blood pressure is 130/80, pulse 70, respirations 20, O2 sat is  100%.  HEENT:  Examination of her head reveals tenderness of the parietal occipital  area on the right without any evidence of skin breakdown or hematoma.  NECK:  Mild tenderness posteriorly, no contusions, abrasions, no swelling.  Excellent range of motion.  CHEST:  There is  tenderness of the entire right hemithorax, more prominent  posteriorly with exquisite tenderness of the right posterolateral chest  wall.  She has good breath sounds bilaterally.  There is no contusion or  abrasion of her back or her thorax.  She has bilateral chest incisions for  previous chest tubes.  ABDOMEN:  Soft and nondistended, nontender.  There is a large incision which  is well healed that extends from the subxiphoid area down to the pubic  symphysis.  There are two healed puncture marks one in the left upper  quadrant, the other in the right upper quadrant.  She has a left subcostal  puncture mark that was the site of the drain tube.  EXTREMITIES:  No cyanosis, clubbing, or edema.  There is an abrasion at the  right elbow without ecchymosis.  There are no contusions or abrasions of the  lower extremities.  Pulses are intact bilaterally.   IMPRESSION:  1.  Blunt chest trauma with traumatic pneumothorax, right.  2.  Closed head trauma with a headache.  3.  Contusion, right elbow.  4.  History of supraventricular tachycardia, stable.  5.  History of multiple drug allergies.  6.  History of remote esophageal perforation with multiple complications,      stable.   PLAN:  The patient will be placed on bed rest.  She will receive analgesics.  She will receive O2.  We will continue to observe her.  Followup chest x-ray  will be done in the morning with an expiratory view.  If the pneumothorax  improves, we will continue to watch her.  If it worsens, she is advised that  she will need to have a closed tube thoracostomy.     Lero   LCS/MEDQ  D:  04/28/2004  T:  04/29/2004  Job:  [id number]

## 2010-11-09 NOTE — Discharge Summary (Signed)
NAMELETRICIA, KRINSKY               ACCOUNT NO.:  [id number]   MEDICAL RECORD NO.:  [id number]          PATIENT TYPE:  INP   LOCATION:  A216                          FACILITY:  [facility]   PHYSICIAN:  Jackie Plum, M.D.DATE OF BIRTH:  03/08/1935   DATE OF ADMISSION:  08/09/2004  DATE OF DISCHARGE:  02/18/2006LH                                 DISCHARGE SUMMARY   DIAGNOSES:  1.  Chest pain, resolved.      1.  Serial cardiac enzymes negative for myocardial infarction.      2.  A 2-D echocardiogram done on 08/10/04 by Dr. Dorethea Clan indicated absence          of any regional wall motion abnormalities. She had normal left          ventricular ejection fraction.  No valvular heart disease.  2.  History of supraventricular tachycardia, status post radiofrequency      ablation in 2003.  3.  Status post Nissen fundoplication for hiatal hernia complicated by      esophageal perforation, status post tonsillectomy and appendectomy.   DISCHARGE MEDICATIONS:  The patient is to start aspirin 81 mg daily with  Lopressor 12.5 mg q.12 h., which are new medicines.   CONSULTANT:  Vida Roller, M.D.   PROCEDURE:  A 2-D echocardiogram as noted above.   CONDITION ON DISCHARGE:  Improved and satisfactory.   DISCHARGE DIET:  Cardiac diet.   FOLLOWUP:  With Dr. Dorethea Clan in about 7-10 days.  The patient is to call for  appointment.  She is to follow up with her primary care physician,  __________.   REASON FOR ADMISSION:  Chest pain.  The patient presented with chest  tightness after a two-mile walk.  She had some discomfort in her jaw as  well.  On presentation to the ED, the patient's EKG showed some mild P-wave  changes in her anterolateral leads and was therefore admitted for further  evaluation of her chest pain.  According to H&P by Dr. Osvaldo Shipper, the  patient was hemodynamic stable on admission.  Her lungs were clear to  auscultation.  Her cardiac exam was unremarkable without any JVD or  edema.  Her BNP was 134, and her cardiac enzymes were negative.  She was therefore  admitted to rule out myocardial infarction and for further assessment of her  chest pain.  Please see full details regarding the patient's presentation.  I reviewed the H&P done two days ago, i.e., August 10, 2004 dictated by  Dr. Rito Ehrlich.   HOSPITAL COURSE:  The patient was admitted to the hospitalist service to a  telemetry bed with nonsignificant  arrhythmias.  She had a BNP done which  was inconsequential.  Had a D-dimer done which was within normal limits, and  therefore, followup CT scan was not done.  The patient was ruled out by  serial cardiac enzymes.  She was started on heparin, which was subsequently  discontinued.  She was reviewed by cardiology service who felt that the  patient's chest pain was very atypical, and has low likelihood for coronary  artery disease.  A 2-D echocardiogram was done which was reviewed by  cardiology yesterday, and it indicated no regional wall motion  abnormalities.  The labs by cardiology yesterday indicated that they have a  low suspicion for coronary artery disease; however, recommended possible  outpatient MPI.  On the last day, Ms Stoll is looking forward to going  home.  She is chest pain free.  She does not have any shortness of breath.  Her vital signs are stable with a discharge BP of 102/60, pulse of 61,  temperature 98.1, respirations 16.  Her saturations were 96% on room air.  She does not have any JVD.  Lungs clear to auscultation.  Cardiac revealed  regular rhythm.  Abdomen is soft, nontender.  Extremities  with no edema, no  cyanosis.  She is alert and oriented x3, no acute focal deficit.   LABORATORY DATA:  Labs done today indicate a WBC count of 4.5, hemoglobin  10.1, hematocrit 47.2, MCV 89.2, platelet count 251.  She had a sodium of  134, potassium of 3.4 on admission. This was not repeated and it will be  followed as an outpatient.   The  patient has been counseled about all the workup that was done in the  hospital, and the rationale behind this workup, and the plan of care at the  outpatient level.  All of the questions by her daughter were answered  appropriately and satisfactorily.  She therefore has been discharged home  with outpatient followup in stable satisfactory condition.      GO/MEDQ  D:  08/11/2004  T:  08/12/2004  Job:  [id number]   cc:   Kingsley Callander. Ouida Sills, MD  [address]  [place]  [place] [address]  Fax: [phone]   Vida Roller, M.D.  Fax: [phone]

## 2010-11-09 NOTE — Consult Note (Signed)
NAMEEVALYNN, HANKINS NO.:  [id number]   MEDICAL RECORD NO.:  [id number]          PATIENT TYPE:  INP   LOCATION:  A216                          FACILITY:  [facility]   PHYSICIAN:  Vida Roller, M.D.   DATE OF BIRTH:  1934/12/11   DATE OF CONSULTATION:  DATE OF DISCHARGE:                                   CONSULTATION   CARDIOLOGY CONSULTATION NOTE   HISTORY OF PRESENT ILLNESS:  [name] is a 76 year old female who has  supraventricular tachycardia status post ablation back in June of 2003.  She  has done well until approximately November of this year when she had a fall  and sustained both a concussion and a traumatic pneumothorax.  She was  admitted to [facility] and did not require tube thoracostomy but was pretty  much incapacitated by the head trauma and has not entirely recovered from  that.  She describes the onset of shortness of breath with chest discomfort  with exertion that started about two days ago.  She describes yesterday  working in her garden and then walking with her daughter and feeling like  the fullness in her chest just would not stop.  She reported to the  emergency department at [facility] and was evaluated and treated.  We were  asked to consult on her.  She describes episodic palpitations associated  with some anxiety and an irregular, slow pulse.  No PND or orthopnea and no  lower extremity edema.  No diaphoresis.  There is no particular radiation to  the discomfort.   PAST MEDICAL HISTORY:  Significant for the supraventricular tachycardia  status post ablation.  She has not had a recurrence of that.  She had a  heart catheterization at that time because it was associated with some  discomfort in her chest, and she had nonobstructive disease with normal LV  function.  She has had a pneumothorax.   PAST SURGICAL HISTORY:  She has had a Nissen fundoplication for  gastroesophageal reflux disease which was complicated by an esophageal  perforation.  She has had a hysterectomy and a tonsillectomy.   SOCIAL HISTORY:  She lives in [place] with her husband.  She is retired.  She is married.  She has never smoked, drank, or used drugs.   CURRENT MEDICATIONS:  Her only medication prior to admission was an over-the-  counter medication called Relieve which I think is a dietary supplement.  She is currently on aspirin, heparin drip, Lopressor, and K-Dur currently.   FAMILY HISTORY:  Her mother died of complications of rheumatoid arthritis.  Her father died of a myocardial infarction at an advanced age.  She has one  brother who has passed away and a sister who is alive, neither one of whom  has coronary disease.   REVIEW OF SYSTEMS:  Her review of systems is significant for pretty  substantial neurologic problems.  She tells me that she has had chronic  headaches associated with some dizziness and gait instability.  Associated  with that are some visual changes as well.  She has also had some  pain in  her chest from where her rib fracture is healing.  She feels like that is  pretty much now without significant difficulty for her.   PHYSICAL EXAMINATION:  She is a thin white female in no apparent distress  who is alert and oriented x4.  Her pulse is 73, respiratory rate is 20,  blood pressure is 114/67.  She weighs 125 pounds.  Examination of the head,  ears, eyes, nose, and throat is unremarkable.  The neck is supple.  There is  no jugular venous distention or carotid bruits.  Her chest is clear to  auscultation bilaterally.  Cardiac exam is regular without a third or fourth  heart sound.  There is no murmur.  Her point of maximal impulse is not  displaced.  Abdomen is soft and nontender.  GU and rectal exam are deferred.  Her extremities are without cyanosis, clubbing, or edema.  Her pulses are  all 2+.  Her musculoskeletal exam is without focality.  Her neurologic exam  was incomplete.   DIAGNOSTIC STUDIES:  Chest  x-ray shows no acute disease.  She does have some  mild what is felt to be COPD.  Electrocardiogram shows sinus rhythm at a  rate of 58 with normal axes, normal intervals.  She does have inverted T  waves across V1 through V3 which appear to be new from an EKG done in 2003.   LABORATORY DATA:  White blood cell count 5.8, H&H of 12 and 34.  Platelet  count of 219.  Sodium 134, potassium 3.4, chloride 100, bicarbonate 26, BUN  14, creatinine 0.8, blood sugar 85.  LFTs are within normal limits.  Her  total protein is 5.9, albumin 3.7, INR of 0, PTT 31.  Point of cares are  negative for acute myocardial infarction.  She had a blood culture done for  reasons that are unclear to me.   This is a woman with chest discomfort which appears to be atypical.  She has  minimal cardiac risk factors and a recent heart catheterization which shows  nonobstructive disease with normal LV function.  Her enzymes are pending,  but her point of cares are reassuring.  Her electrocardiogram is abnormal.  It shows some inverted T waves which do not look to be ischemic, and it is  unknown how long they have been around, but they certainly were upright in  2003.  It is my sense that this is probably not active coronary disease.  However, we are going to pursue this a little bit further.  2. Post-  concussive syndrome which I think is substantial and incapacitating to her,  and it is worth investigating this further.  I think I am going to encourage  her primary to consider neurologic evaluation.  My plan then is to check an  echocardiogram to assess wall motion and LV function.  If these are normal,  then I think she probably would benefit from an outpatient Cardiolite and  she can be sent home.  If on the other hand she has cardiac enzymes which  are abnormal or her echocardiogram looks to be abnormal, then I think we  will encourage heart catheterization to be performed and follow this through.  I think I would  titrate her off the nitroglycerin drip.  She  probably does not need heparin.  We will move forward with further  evaluation.      JH/MEDQ  D:  08/10/2004  T:  08/10/2004  Job:  [id number]  cc:   Kingsley Callander. Ouida Sills, MD  [address]  [place]  [place] [address]  Fax: [phone]   Duke Salvia, M.D.

## 2010-11-09 NOTE — H&P (Signed)
[name], [name]               ACCOUNT NO.:  [id number]   MEDICAL RECORD NO.:  [id number]          PATIENT TYPE:  INP   LOCATION:  A216                          FACILITY:  [facility]   PHYSICIAN:  Osvaldo Shipper, MD     DATE OF BIRTH:  1935/02/08   DATE OF ADMISSION:  08/09/2004  DATE OF DISCHARGE:  LH                                HISTORY & PHYSICAL   The patient's primary medical doctor is [name]. Donna Sills, MD   ADMISSION DIAGNOSES:  1.  Chest pain, rule out acute coronary syndrome.  2.  History of supraventricular tachycardia, status post ablation.   CHIEF COMPLAINT:  Chest pain.   HISTORY OF PRESENT ILLNESS:  The patient is a 76 year old Caucasian female  with past medical history significant for history of supraventricular  tachycardia, status post radiofrequency ablation in June 2003.  The patient  presented to the ER with complaint of chest tightness with onset at about 9  p.m. after the patient returned from a two-mile walk.  The patient mentions  that she has episodes of chest tightness on and off with exertion, which  might include vacuuming of her house, which lasts usually a few minutes, the  maximum was about 30 minutes, and would go away with rest.  Today the  patient went for a walk with her daughter and toward the end of the walk,  the patient started feeling tired and when she returned home she felt  discomfort in her jaw.  This was followed by onset of a chest pain which was  characterized as a tightness and pressure-like sensation in her retrosternal  area, which was started mildly but progressed to become 8-9/10 with  radiation to her left arm.  The patient tried to rest, but there was no  change in her pain.  She took an aspirin, with which also there was no  relief, at which point she decided to come to the emergency room.  The  patient felt diaphoretic, and she felt as if she was having skipped beats.  She did not give any history of palpitations, nausea or  vomiting.  Apart  from the exertion, there were no other aggravating or relieving factors.  The patient mentioned that she has been having these chest pressure symptoms  for the past few years, and she actually underwent a coronary angiogram  about three years ago, which according to the patient did not reveal any  significant disease.  The patient also mentioned that she had a fall back in  October or November 2005 resulting in a pneumothorax and a head concussion,  after which she has never fully recovered.  She gives on and off history of  dizziness with sudden change in position.  She has undergone outpatient  workup for this, including a CT scan of the head, which has not revealed any  abnormality.  Today specifically the patient denied any focal weakness, any  urinary incontinence, or any syncopal episode.  Currently the patient denies  any chest tightness and is resting comfortably.   MEDICATIONS:  The patient used to take aspirin  but not on a daily basis.  Otherwise, she is on no other medications, and she does not take anything  over the counter either.   ALLERGIES:  The patient is allergic to MORPHINE, SULFA and PENICILLIN.  The  MORPHINE causes nausea.  The reaction to the SULFA and the PENICILLIN is not  clearly known.   PAST MEDICAL HISTORY:  1.  Supraventricular tachycardia, status post radiofrequency ablation back      in 2003.  2.  Nissen fundoplication for a hiatal hernia complicated by esophageal      perforation.  3.  Tonsillectomy.  4.  Appendectomy.   SOCIAL HISTORY:  The patient has never smoked any cigarettes.  Denies any  alcohol use or any illicit drug use.  She is married and lives at home with  her husband.   FAMILY HISTORY:  Significant for premature heart disease in her uncles on  her mother's side.  Her father died of a heart attack at the age of 68.  Specifically, there is no history of diabetes or any stroke in the family.   REVIEW OF SYSTEMS:  A  10-point review of systems was done, and this patient  admitted to be anxious all the time.  As mentioned in the HPI, she gave  history of having multiple episodes of dizziness ever since her trauma back  in October or November 2005, which has been worked up as an outpatient.  Specifically, she denied any weight loss.  Otherwise, the review of systems  was negative for any significant findings.   PHYSICAL EXAMINATION:  VITAL SIGNS:  The patient was afebrile.  Her blood  pressure on presentation in the ER was 173/81, currently is 138/70, pulse  rate is about 68 beats per minute, regular.  Respiratory rate is about 16  breaths per minute.  She is saturating about 98-100% on 2 L nasal cannula.  GENERAL:  The patient is a thin female, very anxious, otherwise in no  apparent distress.  HEENT:  There was no pallor noted, no lesions in the oral cavity.  NECK:  Soft and supple.  CHEST: Lungs were clear to auscultation bilaterally with no wheezing,  rhonchi or crackles.  CARDIOVASCULAR:  S1, S2 was normal, regular.  No S3, S4, no murmurs were  appreciated.  There was no JVD, no pedal edema.  All peripheral pulses were  palpable.  ABDOMEN:  Soft, nontender, nondistended.  Bowel sounds were heard.  No  organomegaly was appreciated.  Scar from previous surgery was seen.  GENITOURINARY:  Deferred.  MUSCULOSKELETAL:  Within normal limits.  NEUROLOGIC:  There were no gross findings noted.   LABORATORY DATA:  The CBC is unremarkable, as is a coagulation profile.  Her  BMP shows a sodium of 134, potassium of 3.4, otherwise unremarkable.  Her  liver function tests are also unremarkable.  Cardiac enzymes show troponin  to be negative x3.  An EKG was done, which shows sinus rhythm at 60 beats  per minute.  Axis is normal.  T inversion is seen in V1 and V3.  No other  significant findings seen.  Chest x-ray was also done, which did not show  any acute disease.  IMPRESSION:  This is a 76 year old  Caucasian female with past history of  supraventricular tachycardia, status post radiofrequency ablation and  otherwise no other medical history and on no medications, who presents with  retrosternal chest pressure lasting three to four hours brought on by  exertion but slight relief with  rest and considerable relief with  nitroglycerin.  The patient mentions having similar episodes many times in  the past few years.  She has undergone a cardiac catheterization about three  years ago, which revealed no significant coronary disease.  The patient also  appears to have significant anxiety, which seems to have worsened especially  after the trauma she suffered in October or November of 2005.  However, her  symptoms sound typical for chest pain of cardiac etiology, and she will need  to be admitted under the telemetry unit and ruled out for acute coronary  syndrome.  The patient has been seen by [facility] in the past.   PLAN:  1.  We will cycle her cardiac enzymes.  The patient will be started on      anticoagulation with heparin.  The patient will be put on nitroglycerin      infusion to keep her pain-free.  We will also start the patient on a      baby aspirin, a beta blocker.  We will consult [facility] and      have them see the patient tomorrow morning.  If the patient's cardiac      enzymes go up, she will likely need another coronary angiogram.  If not,      the patient will at least need to undergo a stress test.  2.  The patient seems to have considerable anxiety, which could be      contributing to some of the symptomatology.  We will put the patient on      Xanax on a p.r.n. basis for now.  She may need to be seen by a      psychiatrist or psychologist as an outpatient.  3.  Regarding the patient's dizziness, it appears that she has a form of      benign positional paroxysmal vertigo and apparently has undergone workup      as an outpatient, including CT scan of her  head.  We will defer on      investigating this on this admission unless the patient has severe      symptoms.  Further workup of this can be done as an outpatient.  4.  Further management of this patient will be decided on further blood work      that is done overnight.      GK/MEDQ  D:  08/10/2004  T:  08/10/2004  Job:  [id number]   cc:   [name]. Donna Sills, MD  [address]  [place]  [place] [address]  Fax: [phone]

## 2010-11-09 NOTE — H&P (Signed)
[facility]  Patient:    [name], [name] Visit Number: [id number] MRN: [id number]          Service Type: MED Location: 2A A216 01 Attending Physician:  Herbert Seta Dictated by:   Kari Baars, M.D. Admit Date:  11/27/2001                           History and Physical  REASON FOR ADMISSION:  Status post tachycardia.  HISTORY OF PRESENT ILLNESS:  This is a 76 year old who has a history of what appears to be an aberrant conduction pathway which results in recurrent tachycardic.  She has had a cardiac catheterization done in March of this year that showed no evidence of significant flow-limiting coronary artery occlusive disease.  She also had normal left ventricular function at that time.  She has had episodes of tachycardia in the past which were associated with elevated cardiac enzymes also.  PAST MEDICAL HISTORY: 1. Nissen fundoplication which was complicated by perforation. 2. Tonsillectomy. 3. Appendectomy.  FAMILY HISTORY:  Negative for any sort of known cardiac disease.  SOCIAL HISTORY:  She does not smoke, she does not drink any alcohol.  She is married and lives at home with her husband.  REVIEW OF SYSTEMS:  Except as mentioned, she complains of having some discomfort in her chest.  She apparently had some sort of a reaction to MORPHINE in the emergency room.  She was treated in the emergency room with adenosine and converted to sinus rhythm but still says she is having discomfort.  Her cardiac enzymes are slightly positive again, with an MB of about 7.  Dr. Hulan Saas, who is working in the emergency room, was consulted with Dr. Chales Abrahams, who is the cardiologist on call for [facility], who feels that this is a "washout" of cardiac enzymes from the tachycardia and, without other evidence, does not feel this represents myocardial infarction.  PHYSICAL EXAMINATION:  VITAL SIGNS:  Pulse rate 80, respirations about 15, blood  pressure 110/70.  GENERAL:  Lying quietly, complaining of discomfort in her throat which, she says, feels like it is dry.  CHEST:  Clear.  Without wheezes, rales, or rhonchi.  HEART:  Regular.  Without murmur, gallop, or rub.  ABDOMEN:  Soft.  No masses are felt.  EXTREMITIES:  Show no edema.  LABORATORY DATA:  Initial EKG showed a supraventricular tachycardia with a rate about 170.  After conversion she was in sinus rhythm with a rate in the 90s with high QRS voltage, nonspecific ST and T-wave changes.  CPK total 283, MB 7.2, troponin was 0.02, relative index was 2.5.  Potassium 3.6, sodium 137, chloride 100, CO2 26, glucose 96, BUN 12, creatinine 0.8. Liver enzymes normal.  Chest x-ray does not show any acute changes.  ASSESSMENT:  The patient has had an episode of tachycardia which appears to have some related abnormalities of cardiac enzymes which are relatively minor and not diagnostic of myocardial damage.  PLAN:  Admit her to a monitored bed.  Watch her carefully.  Give her oxygen. Go ahead with treatment for her anxiety.  She did say that this was the worst episode that she had, and she thought she was "going to die" and has been quite distressed over this. Dictated by:   Kari Baars, M.D. Attending Physician:  Herbert Seta DD:  11/27/01 TD:  11/29/01 Job: [id number]

## 2010-11-09 NOTE — Discharge Summary (Signed)
[place]  Patient:    [name], [name] Visit Number: [id number] MRN: [id number]          Service Type: CAT Location: 4700 4735 02 Attending Physician:  Nathen May Dictated by:   Lavella Hammock, P.A.-C. Admit Date:  12/18/2001 Discharge Date: 12/18/2001   CC:         Carylon Perches, M.D.   Referring Physician Discharge [facility]  DATE OF BIRTH:  July 05, 1936DATE OF BIRTH:  January 23, 1929  PROCEDURES:  Radiofrequency catheter ablation.  HOSPITAL COURSE:  Ms. Wesenberg is a 76 year old female with known supraventricular tachycardia, who was seen in the office and evaluated for possible radiofrequency catheter ablation.  The patient was admitted for this on December 18, 2001.  The patient had successful radiofrequency catheter ablation of narrow complex tachycardia with a cycle length of 320 msec.  The ablation was successful.  Postprocedure, the patient was resting comfortably, and her cath sites were stable.  She had some bradycardia postprocedure, but this was not symptomatic.  Dr. Koren Bound instructions were for her to have endocarditis prophylaxis for six months and aspirin every day for six weeks.  The patient was given a prescription for the endocarditis prophylaxis.  Pending completion of her bed rest and hydration, she is considered stable for discharge on December 18, 2001, p.m.  DISCHARGE CONDITION:  Improved.  DISCHARGE DIAGNOSES: 1. Supraventricular tachycardia. 2. Status post Nissen fundoplication complicated by perforation. 3. Status post tonsillectomy and adenoidectomy. 4. History of ALLERGIES:  VANCOMYCIN, PENICILLIN, SULFA, MORPHINE. 5. Insignificant coronary artery disease with a hyperdynamic left ventricle by    cath March 2003. 6. Nonischemic chest pain.  DISCHARGE INSTRUCTIONS: 1. Her activity level is to include no driving, sexual, or strenuous activity    for two days. 2. She is to stick to a low fat diet. 3. She is  to call the office for problems with the cath site. 4. She is to see Dr. Ouida Sills for a wound check in 1-2 weeks. 5. She is to call Dr. Graciela Husbands for an appointment.  DISCHARGE MEDICATIONS 1. Antibiotic prophylaxis. 2. Coated aspirin 325 q.d. x 6 weeks. 3. Over-the-counter medications as prior to admission. Dictated by:   Lavella Hammock, P.A.-C. Attending Physician:  Nathen May DD:  12/18/01 TD:  12/19/01 Job: [id number]

## 2010-11-09 NOTE — Procedures (Signed)
   NAME:  [name], [name]                       ACCOUNT NO.:  [id number]   MEDICAL RECORD NO.:  [id number]                   PATIENT TYPE:  OUT   LOCATION:  RAD                                  FACILITY:  [facility]   PHYSICIAN:  Edward L. Juanetta Gosling, M.D.             DATE OF BIRTH:  12/14/1934   DATE OF PROCEDURE:  02/25/2003  DATE OF DISCHARGE:                              PULMONARY FUNCTION TEST   IMPRESSION:  Spirometry shows no definite ventilatory defect, but does show  mild airflow obstruction at the area of the small airways.                                               Edward L. Juanetta Gosling, M.D.    ELH/MEDQ  D:  02/25/2003  T:  02/25/2003  Job:  [id number]

## 2011-06-20 ENCOUNTER — Encounter: Payer: Self-pay | Admitting: Cardiology

## 2012-05-14 ENCOUNTER — Encounter: Payer: Self-pay | Admitting: *Deleted

## 2012-05-14 ENCOUNTER — Ambulatory Visit (INDEPENDENT_AMBULATORY_CARE_PROVIDER_SITE_OTHER): Payer: Medicare Other | Admitting: Adult Health

## 2012-05-14 ENCOUNTER — Encounter: Payer: Self-pay | Admitting: Adult Health

## 2012-05-14 VITALS — BP 144/83 | HR 58 | Ht 63.5 in | Wt 124.0 lb

## 2012-05-14 DIAGNOSIS — I10 Essential (primary) hypertension: Secondary | ICD-10-CM | POA: Insufficient documentation

## 2012-05-14 DIAGNOSIS — I472 Ventricular tachycardia: Secondary | ICD-10-CM

## 2012-05-14 DIAGNOSIS — R079 Chest pain, unspecified: Secondary | ICD-10-CM

## 2012-05-14 NOTE — Assessment & Plan Note (Signed)
She has been lost to followup, is very anxious about her recurrent discomfort. I tend to believe this is for muscle skeletal however she does have some cardiovascular risk factors. We will plan a stress Myoview which can be changed to a Lexi scan should patient not be able to walk on treadmill. Also have an echocardiogram completed for LV function with history of hypertension, increasing fatigue and shortness of breath. She will followup with Dr. Dietrich Pates to discuss results.  What is a risk stratification labs as she has not been seen by her primary care physician for over two years. Fasting lipids LFTs, TSH, and CBC to evaluate for anemia will be drawn. I believe she will need more reassurance than intervention at this time and she is a very nervous woman. 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 Concerning her fatigue, this may also be related to her age and anxiety. Will make further recommendations once we have results of her tests to review.

## 2012-05-14 NOTE — Assessment & Plan Note (Signed)
Blood pressure is well-controlled currently. She did have some elevation in blood pressure in the past and I have records to confirm that her blood pressure was elevated going to the urgent care Center. We will not make any changes in her blood pressure medications at this time and tell fully assessed.

## 2012-05-14 NOTE — Progress Notes (Signed)
   HPI: [name] is a 77 year old patient of Dr. Lula Bing we have not seen in the office for 2 years. She has a history of recurrent chest discomfort, tachycardia, spine arthritis, and GERD. The patient was in her usual state of health when she had elevated blood pressure noted at 160/88. The patient was told to go to her primary care physician, but there were no openings at that time. She was seen at an urgent care with an EKG revealing some LV strain, she was diagnosed with muscle skeletal pain. However with her recurrent neck shoulder and arm pain with some chest pressure she was advised to followup with cardiology. She states that she continues to have recurrent discomfort in her chest but mostly in her neck and shoulders. She has associated weakness and shortness of breath and her fatigue has become worse. She states she is normally very active involved in the community arts, and with family gatherings. She states she has been unable to feel the energy to continue this activity within the last month.    Review of history also includes a radiofrequency ablation by Dr. Graciela Husbands secondary to PSVT. She had cardiac catheterization in 2003 for chest discomfort and associated supraventricular tachycardia, this revealed no abnormalities.  Allergies  Allergen Reactions  . Glucose   . Morphine   . Penicillins   . Sulfonamide Derivatives   . Vancomycin   . Wheat Bran     Current Outpatient Prescriptions  Medication Sig Dispense Refill  . atenolol (TENORMIN) 50 MG tablet Take 50 mg by mouth daily.         Past Medical History  Diagnosis Date  . PSVT (paroxysmal supraventricular tachycardia) 2003  . Chest pain   . GERD (gastroesophageal reflux disease)   . Pneumothorax 2006  . DJD (degenerative joint disease) of cervical spine   . Head injury, closed   . Diverticulosis   . Osteopenia   . Headache   . Basal cell carcinoma     Past Surgical History  Procedure Date  . Tonsillectomy     . Appendectomy   . Abdominal hysterectomy   . Nissen fundoplication   . Colonoscopy    Review of systems complete and found to be negative unless listed above  ROS: PHYSICAL EXAM BP 144/83  Pulse 58  Ht 5' 3.5" (1.613 m)  Wt 124 lb (56.246 kg)  BMI 21.62 kg/m2  SpO2 97%  General: Well developed, well nourished, in no acute distress, anxious Head: Eyes PERRLA, No xanthomas.   Normal cephalic and atramatic  Lungs: Clear bilaterally to auscultation and percussion. Heart: HRRR S1 S2, with soft systolic murmur.  Pulses are 2+ & equal.            No carotid bruit. No JVD.  No abdominal bruits. No femoral bruits. Abdomen: Bowel sounds are positive, abdomen soft and non-tender without masses or                  Hernia's noted. Msk:  Back normal, normal gait. Normal strength and tone for age. Extremities: No clubbing, cyanosis or edema.  DP +1 Neuro: Alert and oriented X 3. Psych:  Good affect, responds appropriately  EKG NSR rate of 58 bpm.   ASSESSMENT AND PLAN

## 2012-05-14 NOTE — Patient Instructions (Addendum)
Your physician has requested that you have an echocardiogram. Echocardiography is a painless test that uses sound waves to create images of your heart. It provides your doctor with information about the size and shape of your heart and how well your heart's chambers and valves are working. This procedure takes approximately one hour. There are no restrictions for this procedure.  Your physician recommends you have a Stress Test    Your physician recommends that you return for lab work in: NEXT WEEK You will need to be FASTING   Your physician recommends that you schedule a follow-up appointment in: FOLLOW UP WITH RR AFTER TESTING PERFROMED

## 2012-05-14 NOTE — Progress Notes (Deleted)
Name: [name]    DOB: 05/04/1935  Age: 77 y.o.  MR#: [id number]       PCP:  Carylon Perches, MD      Insurance: @PAYORNAME @   CC:    Chief Complaint  Patient presents with  . Follow-up    chest pains and HTN    VS BP 144/83  Pulse 58  Ht 5' 3.5" (1.613 m)  Wt 124 lb (56.246 kg)  BMI 21.62 kg/m2  SpO2 97%  Weights Current Weight  05/14/12 124 lb (56.246 kg)  08/22/10 124 lb (56.246 kg)  07/19/10 122 lb (55.339 kg)    Blood Pressure  BP Readings from Last 3 Encounters:  05/14/12 144/83  08/22/10 130/75  07/19/10 150/78     Admit date:  (Not on file) Last encounter with RMR:  Visit date not found   Allergy Allergies  Allergen Reactions  . Glucose   . Morphine   . Penicillins   . Sulfonamide Derivatives   . Vancomycin   . Wheat Bran     Current Outpatient Prescriptions  Medication Sig Dispense Refill  . atenolol (TENORMIN) 50 MG tablet Take 50 mg by mouth daily.         Discontinued Meds:    Medications Discontinued During This Encounter  Medication Reason  . naproxen sodium (ANAPROX) 220 MG tablet Error    Patient Active Problem List  Diagnosis  . GASTROESOPHAGEAL REFLUX DISEASE  . CHEST PAIN    LABS No visits with results within 3 Month(s) from this visit. Latest known visit with results is:  Hospital Outpatient Visit on 07/19/2010  Component Date Value  . WBC 07/14/2010 7.3   . Hemoglobin 07/14/2010 13.7   . HCT 07/14/2010 38.5   . Platelets 07/14/2010 230   . MCV 07/14/2010 88   . Total CK 07/14/2010 MB- 1.3   . WBC 08/10/2004 5.8   . Hemoglobin 08/10/2004 12   . HCT 08/10/2004 34   . Platelets 08/10/2004 218   . Glucose, Bld 08/10/2004 85   . BUN 08/10/2004 14   . Creatinine, Ser 08/10/2004 0.8   . Sodium 08/10/2004 134   . Potassium 08/10/2004 3.4   . Chloride 08/10/2004 100   . CO2 08/10/2004 26   . Total Protein 08/10/2004 5.9   . Albumin 08/10/2004 3.7   . Glucose, Bld 07/05/2010 99   . BUN 07/05/2010 17   .  Creatinine, Ser 07/05/2010 0.81   . Sodium 07/05/2010 140   . Potassium 07/05/2010 3.8   . Chloride 07/05/2010 103   . CO2 07/05/2010 28   . Glucose, Bld 09/11/2009 87   . BUN 09/11/2009 13   . Creatinine, Ser 09/11/2009 0.79   . Sodium 09/11/2009 141   . Potassium 09/11/2009 4.1   . Chloride 09/11/2009 103   . CO2 09/11/2009 28   . AST 09/11/2009 17   . ALT 09/11/2009 13   . Total Bilirubin 09/11/2009 0.6   . Alkaline Phosphatase 09/11/2009 67   . Calcium 09/11/2009 8.7   . Total Protein 09/11/2009 6.9   . Albumin 09/11/2009 4.4   . TSH 09/11/2009 1.04      Results for this Opt Visit:     Results for orders placed during the hospital encounter of 07/19/10  CONVERTED [facility] LAB      Component Value Range   WBC 7.3     Hemoglobin 13.7     HCT 38.5     Platelets 230  MCV 88     Total CK MB- 1.3    CONVERTED [facility] LAB      Component Value Range   WBC 5.8     Hemoglobin 12     HCT 34     Platelets 218     Glucose, Bld 85     BUN 14     Creatinine, Ser 0.8     Sodium 134     Potassium 3.4     Chloride 100     CO2 26     Total Protein 5.9     Albumin 3.7    CONVERTED [facility] LAB      Component Value Range   Glucose, Bld 99     BUN 17     Creatinine, Ser 0.81     Sodium 140     Potassium 3.8     Chloride 103     CO2 28    CONVERTED [facility] LAB      Component Value Range   Glucose, Bld 87     BUN 13     Creatinine, Ser 0.79     Sodium 141     Potassium 4.1     Chloride 103     CO2 28     AST 17     ALT 13     Total Bilirubin 0.6     Alkaline Phosphatase 67     Calcium 8.7     Total Protein 6.9     Albumin 4.4     TSH 1.04      EKG Orders placed in visit on 05/14/12  . EKG 12-LEAD     Prior Assessment and Plan Problem List as of 05/14/2012          GASTROESOPHAGEAL REFLUX DISEASE   CHEST PAIN       Imaging: No results found.   FRS Calculation: Score not calculated. Missing: Total Cholesterol, HDL

## 2012-05-27 ENCOUNTER — Telehealth: Payer: Self-pay | Admitting: *Deleted

## 2012-05-27 NOTE — Telephone Encounter (Signed)
FYI: Noted pt had cancelled both Myoview and Echo scheduled for 05-29-12, pt was contacted by staff TS and pt advised the following:  Pt did not want to re-schedule either test at this time.

## 2012-05-29 ENCOUNTER — Encounter (HOSPITAL_COMMUNITY): Payer: Medicare Other

## 2012-05-29 ENCOUNTER — Other Ambulatory Visit (HOSPITAL_COMMUNITY): Payer: Medicare Other

## 2012-05-29 ENCOUNTER — Ambulatory Visit (HOSPITAL_COMMUNITY): Payer: Medicare Other

## 2012-06-04 NOTE — Telephone Encounter (Signed)
FYI: pt cancelled Myoview and Echo and did not want to re-schedule tests at this time per scheduler

## 2012-06-05 NOTE — Telephone Encounter (Signed)
I have reviewed her chart and reasons that tests were ordered. She complained of recurrent chest pain,  DOE. If symptoms persist on next appointment, will readdress need to proceed with testing.

## 2012-06-08 ENCOUNTER — Ambulatory Visit: Payer: Medicare Other | Admitting: Cardiology

## 2013-03-15 ENCOUNTER — Encounter: Payer: Self-pay | Admitting: *Deleted

## 2013-11-29 ENCOUNTER — Ambulatory Visit (INDEPENDENT_AMBULATORY_CARE_PROVIDER_SITE_OTHER): Payer: Medicare Other | Admitting: Diagnostic Neuroimaging

## 2013-11-29 ENCOUNTER — Encounter: Payer: Self-pay | Admitting: Diagnostic Neuroimaging

## 2013-11-29 VITALS — BP 135/78 | HR 66 | Temp 98.2°F | Ht 59.5 in | Wt 110.5 lb

## 2013-11-29 DIAGNOSIS — M79609 Pain in unspecified limb: Secondary | ICD-10-CM

## 2013-11-29 DIAGNOSIS — M79671 Pain in right foot: Secondary | ICD-10-CM

## 2013-11-29 DIAGNOSIS — M79672 Pain in left foot: Secondary | ICD-10-CM

## 2013-11-29 DIAGNOSIS — R209 Unspecified disturbances of skin sensation: Secondary | ICD-10-CM

## 2013-11-29 DIAGNOSIS — R200 Anesthesia of skin: Secondary | ICD-10-CM

## 2013-11-29 NOTE — Patient Instructions (Signed)
I will check labs and EMG (electric nerve test).

## 2013-11-29 NOTE — Progress Notes (Signed)
[facility]  PATIENT: [name] DOB: 10/17/1934  REFERRING CLINICIAN: Fagan  HISTORY FROM: patient  REASON FOR VISIT: new consult    HISTORICAL  CHIEF COMPLAINT:  Chief Complaint  Patient presents with  . Peripheral Neuropathy    numbness in feet and ankles bilateral    HISTORY OF PRESENT ILLNESS:   79 year old left-handed female here for evaluation of numbness and pain in ankles and feet for past one year. Symptoms worse when she is standing or walking. She has burning, pins and needle sensation. No evaluation the past. She's not taken any medication for this.  For past 20 years patient has also had Raynaud's phenomenon in the fingers and toes. With cold temperature, her fingers and toes changed color to bright red, then blew, then white.  REVIEW OF SYSTEMS: Full 14 system review of systems performed and notable only for as per history of present illness otherwise negative.  ALLERGIES: Allergies  Allergen Reactions  . Glucose   . Morphine Nausea And Vomiting and Other (See Comments)    HA  . Sulfonamide Derivatives   . Vancomycin   . Wheat Bran Swelling and Other (See Comments)    Swelling (eyes), sore spots in head  . Penicillins Rash    HOME MEDICATIONS: Outpatient Prescriptions Prior to Visit  Medication Sig Dispense Refill  . atenolol (TENORMIN) 50 MG tablet Take 50 mg by mouth daily.        No facility-administered medications prior to visit.    PAST MEDICAL HISTORY: Past Medical History  Diagnosis Date  . PSVT (paroxysmal supraventricular tachycardia) 2003  . Chest pain   . GERD (gastroesophageal reflux disease)   . Pneumothorax 2006  . DJD (degenerative joint disease) of cervical spine   . Head injury, closed   . Diverticulosis   . Osteopenia   . Headache(784.0)   . Basal cell carcinoma     PAST SURGICAL HISTORY: Past Surgical History  Procedure Laterality Date  . Tonsillectomy    . Appendectomy    .  Abdominal hysterectomy    . Nissen fundoplication    . Colonoscopy      FAMILY HISTORY: Family History  Problem Relation Age of Onset  . Arthritis Mother   . Vasculitis Mother   . Heart disease Father   . Parkinsonism Father   . Arthritis Sister   . Kidney failure Brother   . Asthma Sister   . Heart disease Sister     SOCIAL HISTORY:  History   Social History  . Marital Status: Married    Spouse Name: Myles    Number of Children: 4  . Years of Education: College   Occupational History  . [employer]    Social History Main Topics  . Smoking status: Never Smoker   . Smokeless tobacco: Never Used  . Alcohol Use: No  . Drug Use: No  . Sexual Activity: Not on file   Other Topics Concern  . Not on file   Social History Narrative   No regular exercise   Patient lives at home with spouse.   Caffeine Use:     PHYSICAL EXAM  Filed Vitals:   11/29/13 0952  BP: 135/78  Pulse: 66  Temp: 98.2 F (36.8 C)  TempSrc: Oral  Height: 4' 11.5" (1.511 m)  Weight: 110 lb 8 oz (50.122 kg)    Not recorded    Body mass index is 21.95 kg/(m^2).  GENERAL EXAM: Patient is in no distress; well developed, nourished  and groomed; neck is supple; HIGH ARCHES IN FEET.  CARDIOVASCULAR: Regular rate and rhythm, no murmurs, no carotid bruits  NEUROLOGIC: MENTAL STATUS: awake, alert, oriented to person, place and time, recent and remote memory intact, normal attention and concentration, language fluent, comprehension intact, naming intact, fund of knowledge appropriate CRANIAL NERVE: no papilledema on fundoscopic exam, pupils equal and reactive to light, visual fields full to confrontation, extraocular muscles intact, no nystagmus, facial sensation and strength symmetric, hearing intact, palate elevates symmetrically, uvula midline, shoulder shrug symmetric, tongue midline. MOTOR: normal bulk and tone, full strength in the BUE, BLE SENSORY: normal and symmetric to light touch,  pinprick, temperature, proprioception; RIGHT TOE VIB 4 SEC, LEFT TOE VIB 8 SEC COORDINATION: finger-nose-finger, fine finger movements normal REFLEXES: deep tendon reflexes present and symmetric; EXCEPT ABSENT AT ANKLES. GAIT/STATION: narrow based gait; able to walk on toes, heels and tandem; romberg is negative    DIAGNOSTIC DATA (LABS, IMAGING, TESTING) - I reviewed patient records, labs, notes, testing and imaging myself where available.  Lab Results  Component Value Date   WBC 7.3 07/14/2010   HGB 13.7 07/14/2010   HCT 38.5 07/14/2010   MCV 87.5 07/14/2010   PLT 230 07/14/2010      Component Value Date/Time   NA 140 07/14/2010 0101   K 3.8 07/14/2010 0101   CL 103 07/14/2010 0101   CO2 28 07/14/2010 0101   GLUCOSE 99 07/14/2010 0101   BUN 17 07/14/2010 0101   CREATININE 0.81 07/14/2010 0101   CALCIUM 9.5 07/14/2010 0101   PROT 6.9 09/11/2009   ALBUMIN 4.4 09/11/2009   AST 17 09/11/2009   ALT 13 09/11/2009   ALKPHOS 67 09/11/2009   BILITOT 0.6 09/11/2009   GFRNONAA >60 07/14/2010 0101   GFRAA  Value: >60        The eGFR has been calculated using the MDRD equation. This calculation has not been validated in all clinical situations. eGFR's persistently <60 mL/min signify possible Chronic Kidney Disease. 07/14/2010 0101   No results found for this basename: CHOL, HDL, LDLCALC, LDLDIRECT, TRIG, CHOLHDL   No results found for this basename: HGBA1C   No results found for this basename: VITAMINB12   Lab Results  Component Value Date   TSH 1.04 09/11/2009      ASSESSMENT AND PLAN  79 y.o. year old female here with 20 years of Raynaud's phenomenon in fingers and toes, now with one year of numbness, burning, tingling and pain in her feet and ankles. Will check neuropathy workup.  Ddx: neuropathy (metabolic, nutritional, autoimmune, idiopathic) vs lumbar polyradiculopathy vs connective tissue dz vs raynaud's phenomenon  PLAN: Orders Placed This Encounter  Procedures  . Neuropathy Panel    . NCV with EMG(electromyography)   Return for for NCV/EMG.    Penni Bombard, MD 2/0/3559, 74:16 AM Certified in Neurology, Neurophysiology and Neuroimaging  [facility] [address] [place], [place] [address] [phone]

## 2013-12-01 LAB — NEUROPATHY PANEL
A/G RATIO SPE: 1.7 (ref 0.7–2.0)
ALBUMIN ELP: 4.1 g/dL (ref 3.2–5.6)
ANA: NEGATIVE
Alpha 1: 0.1 g/dL (ref 0.1–0.4)
Alpha 2: 0.6 g/dL (ref 0.4–1.2)
Angio Convert Enzyme: <14 U/L — ABNORMAL LOW (ref 14–82)
BETA: 0.9 g/dL (ref 0.6–1.3)
GAMMA GLOBULIN: 0.8 g/dL (ref 0.5–1.6)
Globulin, Total: 2.4 g/dL (ref 2.0–4.5)
Rhuematoid fact SerPl-aCnc: 7.2 IU/mL (ref 0.0–13.9)
SED RATE: 2 mm/h (ref 0–40)
TSH: 1.13 u[IU]/mL (ref 0.450–4.500)
Total Protein: 6.5 g/dL (ref 6.0–8.5)
Vit D, 25-Hydroxy: 23.5 ng/mL — ABNORMAL LOW (ref 30.0–100.0)
Vitamin B-12: 601 pg/mL (ref 211–946)

## 2013-12-02 ENCOUNTER — Telehealth: Payer: Self-pay | Admitting: Diagnostic Neuroimaging

## 2013-12-02 NOTE — Telephone Encounter (Signed)
Left message for patient regarding rescheduling 12/20/13 appointment per Dr. Gladstone Lighter schedule, mailed new NCV/EMG information sheet with new appointment time.

## 2013-12-20 ENCOUNTER — Encounter: Payer: Medicare Other | Admitting: Diagnostic Neuroimaging

## 2013-12-29 ENCOUNTER — Encounter: Payer: Medicare Other | Admitting: Diagnostic Neuroimaging

## 2013-12-31 ENCOUNTER — Encounter: Payer: Self-pay | Admitting: Diagnostic Neuroimaging

## 2014-01-11 ENCOUNTER — Encounter: Payer: Medicare Other | Admitting: Radiology

## 2014-01-11 ENCOUNTER — Telehealth: Payer: Self-pay | Admitting: *Deleted

## 2014-01-11 ENCOUNTER — Encounter: Payer: Medicare Other | Admitting: Diagnostic Neuroimaging

## 2014-01-11 NOTE — Telephone Encounter (Signed)
Called pt and spoke with pt's husband [name], informing him per Dr. Janann Colonel, [facility], that the pt's lab results showed the pt having a low vitamin D level and that the pt should take a daily vitamin D supplement. I advised the husband that if the pt has any other problems, questions or concerns to call the office. Husband verbalized understanding. Thanks Dr. Janann Colonel

## 2014-01-11 NOTE — Telephone Encounter (Signed)
Please clarify what lab results he wants. The ones we have are from over a month ago. If those are the ones he is looking for then his vitamin D level is low and he should take a daily vitamin D supplement. Thanks.

## 2014-01-11 NOTE — Telephone Encounter (Signed)
Pt calling for lab work results. Dr. Leta Baptist out of the office, sending to [facility], Dr. Janann Colonel, please advise. Thanks

## 2014-01-19 ENCOUNTER — Encounter (INDEPENDENT_AMBULATORY_CARE_PROVIDER_SITE_OTHER): Payer: Self-pay | Admitting: Radiology

## 2014-01-19 ENCOUNTER — Ambulatory Visit (INDEPENDENT_AMBULATORY_CARE_PROVIDER_SITE_OTHER): Payer: Medicare Other | Admitting: Diagnostic Neuroimaging

## 2014-01-19 DIAGNOSIS — M79672 Pain in left foot: Secondary | ICD-10-CM

## 2014-01-19 DIAGNOSIS — Z0289 Encounter for other administrative examinations: Secondary | ICD-10-CM

## 2014-01-19 DIAGNOSIS — R200 Anesthesia of skin: Secondary | ICD-10-CM

## 2014-01-19 DIAGNOSIS — M79609 Pain in unspecified limb: Secondary | ICD-10-CM

## 2014-01-19 DIAGNOSIS — M79671 Pain in right foot: Secondary | ICD-10-CM

## 2014-01-19 DIAGNOSIS — R209 Unspecified disturbances of skin sensation: Secondary | ICD-10-CM

## 2014-01-21 NOTE — Procedures (Signed)
   [facility]  NCS (NERVE CONDUCTION STUDY) WITH EMG (ELECTROMYOGRAPHY) REPORT   STUDY DATE: 01/19/14 PATIENT NAME: [name] DOB: 1935/01/22 MRN: [id number]  ORDERING CLINICIAN: Andrey Spearman, MD   TECHNOLOGIST: Towana Badger  ELECTROMYOGRAPHER: Earlean Polka. Jennesis Ramaswamy, MD  CLINICAL INFORMATION: 79 year old female with lower extremity pain and numbness and weakness.  FINDINGS: NERVE CONDUCTION STUDY: Left median motor responses normal distal latency, decreased amplitude, normal conduction velocity and normal F-wave latency. Left ulnar motor response and F-wave latency normal. Bilateral peroneal and right tibial motor responses have normal distal latency, decreased up gaze, normal conduction velocities and normal F-wave latencies. Left tibial motor response was deferred due to the recent foot injury.  Left median, left ulnar, bilateral sural sensory responses are normal.  NEEDLE ELECTROMYOGRAPHY: Needle examination of left lower extremity demonstrates: Vastus medialis is normal. Tibialis anterior has no spontaneous activity at rest and decreased motor unit recruitment exertion. Gastrocnemius is normal. Left L4-5 paraspinal muscles demonstrate 1+ fibrillation potentials.  IMPRESSION:  Abnormal study demonstrating: 1. Length-dependent axonal motor neuropathy. 2. Underlying left L5 radiculopathy.    INTERPRETING PHYSICIAN:  Penni Bombard, MD Certified in Neurology, Neurophysiology and Neuroimaging  [facility] [address], [place], [place] [address] [phone]

## 2014-04-08 ENCOUNTER — Other Ambulatory Visit (HOSPITAL_COMMUNITY): Payer: Self-pay | Admitting: Internal Medicine

## 2014-04-08 DIAGNOSIS — Z780 Asymptomatic menopausal state: Secondary | ICD-10-CM

## 2014-04-14 ENCOUNTER — Ambulatory Visit (HOSPITAL_COMMUNITY)
Admission: RE | Admit: 2014-04-14 | Discharge: 2014-04-14 | Disposition: A | Discharge status: Home / Self Care | Payer: Medicare Other | Source: Ambulatory Visit | Attending: Internal Medicine | Admitting: Internal Medicine

## 2014-04-14 DIAGNOSIS — Z780 Asymptomatic menopausal state: Secondary | ICD-10-CM | POA: Insufficient documentation

## 2014-07-19 ENCOUNTER — Other Ambulatory Visit: Payer: Self-pay | Admitting: Podiatry

## 2014-07-19 ENCOUNTER — Ambulatory Visit (INDEPENDENT_AMBULATORY_CARE_PROVIDER_SITE_OTHER): Payer: Medicare Other

## 2014-07-19 ENCOUNTER — Ambulatory Visit (INDEPENDENT_AMBULATORY_CARE_PROVIDER_SITE_OTHER): Payer: Medicare Other | Admitting: Podiatry

## 2014-07-19 ENCOUNTER — Encounter: Payer: Self-pay | Admitting: Podiatry

## 2014-07-19 VITALS — BP 133/71 | HR 68 | Resp 16

## 2014-07-19 DIAGNOSIS — M79673 Pain in unspecified foot: Secondary | ICD-10-CM | POA: Diagnosis not present

## 2014-07-19 DIAGNOSIS — G629 Polyneuropathy, unspecified: Secondary | ICD-10-CM

## 2014-07-19 DIAGNOSIS — Z79899 Other long term (current) drug therapy: Secondary | ICD-10-CM | POA: Diagnosis not present

## 2014-07-19 LAB — TSH: TSH: 1.616 u[IU]/mL (ref 0.350–4.500)

## 2014-07-19 LAB — FOLATE: Folate: >20 ng/mL

## 2014-07-19 LAB — VITAMIN B12: Vitamin B-12: 480 pg/mL (ref 211–911)

## 2014-07-19 LAB — HEMOGLOBIN A1C
Hgb A1c MFr Bld: 5.6 % (ref <5.7)
MEAN PLASMA GLUCOSE: 114 mg/dL (ref <117)

## 2014-07-19 MED ORDER — GABAPENTIN 100 MG PO CAPS: 100.0000 mg | ORAL_CAPSULE | Freq: Every day | ORAL | Status: DC

## 2014-07-19 NOTE — Progress Notes (Signed)
**Note Donna-Identified via Obfuscation**    Subjective:    Patient ID: [name], female    DOB: [birth year], 80 y.o.   MRN: [id number]  HPI Comments: "I have awful pain with my feet"  Patient c/o aching, tightness bilateral feet, the whole foot, for few years. She states that she feels like there is a tight rope around her ankle. She has swelling in her legs. Walking a lot makes feet worse. Usually worse at night and with cold weather. She has seen a neurologist and they tested nerves(negative). She has tried essential oils, aleve, tylenol-no help.  Foot Pain Associated symptoms include arthralgias, numbness and weakness.      Review of Systems  Eyes: Positive for itching.  Cardiovascular: Positive for leg swelling.  Endocrine: Positive for cold intolerance and heat intolerance.  Musculoskeletal: Positive for back pain, arthralgias and gait problem.  Allergic/Immunologic: Positive for food allergies.  Neurological: Positive for weakness, light-headedness and numbness.  Hematological: Bruises/bleeds easily.  All other systems reviewed and are negative.      Objective:   Physical Exam: I have reviewed her past medical history medications allergies surgery social history and review of systems. Pulses are strongly palpable bilateral. Neurologic sensorium is questionably diminished per Semmes-Weinstein monofilament. Deep tendon reflexes are intact and muscle strength is intact bilateral. Muscle strength is 5 over 5 dorsiflexion plantar flexors and inverters everters on physical musculatures intact. Orthopedic evaluation does hurts all joints distal to the ankle for range of motion without crepitation. Mild hammertoe deformities bilateral A symptomatic.        Assessment & Plan:  Assessment: Idiopathic neuropathy bilateral.  Plan: Started her on gabapentin 100 mg 1 by mouth daily at bedtime follow up with her in 1 month we also did blood work consisting of TSH, B-12 folate and D3.

## 2014-07-20 LAB — VITAMIN D 25 HYDROXY (VIT D DEFICIENCY, FRACTURES): Vit D, 25-Hydroxy: 18 ng/mL — ABNORMAL LOW (ref 30–100)

## 2014-08-01 ENCOUNTER — Telehealth: Payer: Self-pay | Admitting: Podiatry

## 2014-08-01 NOTE — Telephone Encounter (Signed)
Pt called and asked if we could please send her labs results including B12 to Dr Asencion Noble today so she can get treatment from him. His fax # is  [phone].

## 2014-08-02 ENCOUNTER — Telehealth: Payer: Self-pay | Admitting: *Deleted

## 2014-08-02 NOTE — Telephone Encounter (Signed)
I called to see if her insurance had changed, BAKO said her insurance was invalid.  "What do you have on file?  I have [employer]."  We have [place],  "That's my husbands insurance."  Can you give me the member ID and group number?  The member ID is [id number] and the group number is [id number]."

## 2014-08-16 ENCOUNTER — Ambulatory Visit (INDEPENDENT_AMBULATORY_CARE_PROVIDER_SITE_OTHER): Payer: Medicare Other | Admitting: Podiatry

## 2014-08-16 VITALS — BP 118/73 | HR 72 | Resp 16

## 2014-08-16 DIAGNOSIS — M79673 Pain in unspecified foot: Secondary | ICD-10-CM | POA: Diagnosis not present

## 2014-08-16 DIAGNOSIS — G629 Polyneuropathy, unspecified: Secondary | ICD-10-CM

## 2014-08-16 MED ORDER — GABAPENTIN 100 MG PO CAPS: 100.0000 mg | ORAL_CAPSULE | Freq: Three times a day (TID) | ORAL | Status: DC

## 2014-08-16 NOTE — Progress Notes (Signed)
She presents today to follow-up of her neuropathy and her medication. She states that the 100 mg of gabapentin at nighttime seems to be helping with her nocturnal symptoms. She states that she had one bout of dizziness.  Objective: Vital signs are stable she is alert and oriented 3 pulses remain palpable bilateral. Other than that no changes in physical exam.  Assessment: Neuropathy bilateral.  Plan: Started her on 100 mg of gabapentin 3 times a day I will follow-up with her in 1 month.

## 2014-09-13 ENCOUNTER — Ambulatory Visit: Payer: Medicare Other | Admitting: Podiatry

## 2014-10-18 ENCOUNTER — Ambulatory Visit: Payer: Medicare Other | Admitting: Podiatry

## 2014-10-20 ENCOUNTER — Telehealth: Payer: Self-pay | Admitting: Internal Medicine

## 2014-10-20 NOTE — Telephone Encounter (Signed)
TCS TRIAGE

## 2014-10-21 NOTE — Telephone Encounter (Signed)
Letter mailed to pt.  

## 2014-11-18 ENCOUNTER — Emergency Department (HOSPITAL_COMMUNITY): Payer: No Typology Code available for payment source

## 2014-11-18 ENCOUNTER — Emergency Department (HOSPITAL_COMMUNITY)
Admission: EM | Admit: 2014-11-18 | Discharge: 2014-11-18 | Disposition: A | Discharge status: Home / Self Care | Payer: No Typology Code available for payment source | Attending: Emergency Medicine | Admitting: Emergency Medicine

## 2014-11-18 ENCOUNTER — Encounter (HOSPITAL_COMMUNITY): Payer: Self-pay | Admitting: Emergency Medicine

## 2014-11-18 DIAGNOSIS — Z85828 Personal history of other malignant neoplasm of skin: Secondary | ICD-10-CM | POA: Insufficient documentation

## 2014-11-18 DIAGNOSIS — Z880 Allergy status to penicillin: Secondary | ICD-10-CM | POA: Diagnosis not present

## 2014-11-18 DIAGNOSIS — Y9389 Activity, other specified: Secondary | ICD-10-CM | POA: Diagnosis not present

## 2014-11-18 DIAGNOSIS — Z23 Encounter for immunization: Secondary | ICD-10-CM | POA: Diagnosis not present

## 2014-11-18 DIAGNOSIS — S2220XA Unspecified fracture of sternum, initial encounter for closed fracture: Secondary | ICD-10-CM | POA: Diagnosis not present

## 2014-11-18 DIAGNOSIS — S8255XA Nondisplaced fracture of medial malleolus of left tibia, initial encounter for closed fracture: Secondary | ICD-10-CM | POA: Insufficient documentation

## 2014-11-18 DIAGNOSIS — S8252XA Displaced fracture of medial malleolus of left tibia, initial encounter for closed fracture: Secondary | ICD-10-CM

## 2014-11-18 DIAGNOSIS — S2222XA Fracture of body of sternum, initial encounter for closed fracture: Secondary | ICD-10-CM | POA: Insufficient documentation

## 2014-11-18 DIAGNOSIS — Z8739 Personal history of other diseases of the musculoskeletal system and connective tissue: Secondary | ICD-10-CM | POA: Insufficient documentation

## 2014-11-18 DIAGNOSIS — K219 Gastro-esophageal reflux disease without esophagitis: Secondary | ICD-10-CM | POA: Diagnosis not present

## 2014-11-18 DIAGNOSIS — Z8679 Personal history of other diseases of the circulatory system: Secondary | ICD-10-CM | POA: Insufficient documentation

## 2014-11-18 DIAGNOSIS — M79641 Pain in right hand: Secondary | ICD-10-CM | POA: Diagnosis not present

## 2014-11-18 DIAGNOSIS — M7989 Other specified soft tissue disorders: Secondary | ICD-10-CM | POA: Diagnosis not present

## 2014-11-18 DIAGNOSIS — S279XXA Injury of unspecified intrathoracic organ, initial encounter: Secondary | ICD-10-CM | POA: Diagnosis not present

## 2014-11-18 DIAGNOSIS — Z8709 Personal history of other diseases of the respiratory system: Secondary | ICD-10-CM | POA: Diagnosis not present

## 2014-11-18 DIAGNOSIS — Z7982 Long term (current) use of aspirin: Secondary | ICD-10-CM | POA: Insufficient documentation

## 2014-11-18 DIAGNOSIS — R079 Chest pain, unspecified: Secondary | ICD-10-CM | POA: Diagnosis not present

## 2014-11-18 DIAGNOSIS — Y92410 Unspecified street and highway as the place of occurrence of the external cause: Secondary | ICD-10-CM | POA: Diagnosis not present

## 2014-11-18 DIAGNOSIS — S6991XA Unspecified injury of right wrist, hand and finger(s), initial encounter: Secondary | ICD-10-CM | POA: Insufficient documentation

## 2014-11-18 DIAGNOSIS — Y998 Other external cause status: Secondary | ICD-10-CM | POA: Diagnosis not present

## 2014-11-18 DIAGNOSIS — S59901A Unspecified injury of right elbow, initial encounter: Secondary | ICD-10-CM | POA: Insufficient documentation

## 2014-11-18 DIAGNOSIS — S299XXA Unspecified injury of thorax, initial encounter: Secondary | ICD-10-CM | POA: Diagnosis present

## 2014-11-18 MED ORDER — ONDANSETRON 8 MG PO TBDP: 8.0000 mg | ORAL_TABLET | Freq: Three times a day (TID) | ORAL | Status: DC | PRN

## 2014-11-18 MED ORDER — MORPHINE SULFATE 2 MG/ML IJ SOLN: 2.0000 mg | Freq: Once | INTRAMUSCULAR | Status: DC

## 2014-11-18 MED ORDER — ONDANSETRON HCL 4 MG/2ML IJ SOLN: 4.0000 mg | Freq: Once | INTRAMUSCULAR | Status: DC

## 2014-11-18 MED ORDER — TRAMADOL HCL 50 MG PO TABS: 50.0000 mg | ORAL_TABLET | Freq: Four times a day (QID) | ORAL | Status: DC | PRN

## 2014-11-18 MED ORDER — HYDROCODONE-ACETAMINOPHEN 5-325 MG PO TABS
1.0000 | ORAL_TABLET | Freq: Once | ORAL | Status: AC
  Administered 2014-11-18: 1 via ORAL
  Filled 2014-11-18: qty 1

## 2014-11-18 MED ORDER — TETANUS-DIPHTH-ACELL PERTUSSIS 5-2.5-18.5 LF-MCG/0.5 IM SUSP
0.5000 mL | Freq: Once | INTRAMUSCULAR | Status: AC
  Administered 2014-11-18: 0.5 mL via INTRAMUSCULAR
  Filled 2014-11-18: qty 0.5

## 2014-11-18 NOTE — ED Notes (Signed)
Pain med given 

## 2014-11-18 NOTE — ED Notes (Signed)
Pt. And family have been updated constantly. Pt is insisting on wanting to go home. Delay explained.

## 2014-11-18 NOTE — ED Notes (Signed)
Patient transported to X-ray 

## 2014-11-18 NOTE — ED Notes (Signed)
abrasioin rt knee cleaned with soap and water nonadhering dressing placed rt knee with kling

## 2014-11-18 NOTE — ED Notes (Signed)
Family at bedside. 
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
## 2014-11-18 NOTE — ED Notes (Signed)
The pt still does not want anything for pain

## 2014-11-18 NOTE — Discharge Instructions (Signed)
It was our pleasure to provide your ER care today - we hope that you feel better.  Wear ankle splint, keep clean and dry.  Elevate ankle to help with pain and swelling. Use walker or wheelchair for mobility. No weight bearing on left ankle/foot until cleared to do so by orthopedist.  We discussed your case with Dr Percell Miller, our orthopedist on call - he indicates for you to follow up with him in the office this Wednesday morning at 830 AM - call office Tuesday morning to verify appointment time.  For sternal fracture - take full and deep breaths.  Use incentive spirometer, 10 full breaths in every hour while awake.  Hold pillow to chest if/when coughing to help with the pain.  Take motrin or aleve as need for pain.  You may also take ultram as need for pain - no driving when taking.  If pain medication causes nausea/vomiting, you may take zofran as need for nausea.  Follow up with your primary care doctor in coming week for recheck.  Return to ER if worse, worsening or intractable pain, trouble breathing, fevers, severe headache, numbness/weakness, other concern.     Ankle Fracture A fracture is a break in a bone. The ankle joint is made up of three bones. These include the lower (distal)sections of your lower leg bones, called the tibia and fibula, along with a bone in your foot, called the talus. Depending on how bad the break is and if more than one ankle joint bone is broken, a cast or splint is used to protect and keep your injured bone from moving while it heals. Sometimes, surgery is required to help the fracture heal properly.  There are two general types of fractures:  Stable fracture. This includes a single fracture line through one bone, with no injury to ankle ligaments. A fracture of the talus that does not have any displacement (movement of the bone on either side of the fracture line) is also stable.  Unstable fracture. This includes more than one fracture line through one or  more bones in the ankle joint. It also includes fractures that have displacement of the bone on either side of the fracture line. CAUSES  A direct blow to the ankle.   Quickly and severely twisting your ankle.  Trauma, such as a car accident or falling from a significant height. RISK FACTORS You may be at a higher risk of ankle fracture if: 1. You have certain medical conditions. 2. You are involved in high-impact sports. 3. You are involved in a high-impact car accident. SIGNS AND SYMPTOMS   Tender and swollen ankle.  Bruising around the injured ankle.  Pain on movement of the ankle.  Difficulty walking or putting weight on the ankle.  A cold foot below the site of the ankle injury. This can occur if the blood vessels passing through your injured ankle were also damaged.  Numbness in the foot below the site of the ankle injury. DIAGNOSIS  An ankle fracture is usually diagnosed with a physical exam and X-rays. A CT scan may also be required for complex fractures. TREATMENT  Stable fractures are treated with a cast or splint and using crutches to avoid putting weight on your injured ankle. This is followed by an ankle strengthening program. Some patients require a special type of cast, depending on other medical problems they may have. Unstable fractures require surgery to ensure the bones heal properly. Your health care provider will tell you what type of  fracture you have and the best treatment for your condition. HOME CARE INSTRUCTIONS   Review correct crutch use with your health care provider and use your crutches as directed. Safe use of crutches is extremely important. Misuse of crutches can cause you to fall or cause injury to nerves in your hands or armpits.  Do not put weight or pressure on the injured ankle until directed by your health care provider.  To lessen the swelling, keep the injured leg elevated while sitting or lying down.  Apply ice to the injured  area:  Put ice in a plastic bag.  Place a towel between your cast and the bag.  Leave the ice on for 20 minutes, 2-3 times a day.  If you have a plaster or fiberglass cast:  Do not try to scratch the skin under the cast with any objects. This can increase your risk of skin infection.  Check the skin around the cast every day. You may put lotion on any red or sore areas.  Keep your cast dry and clean.  If you have a plaster splint:  Wear the splint as directed.  You may loosen the elastic around the splint if your toes become numb, tingle, or turn cold or blue.  Do not put pressure on any part of your cast or splint; it may break. Rest your cast only on a pillow the first 24 hours until it is fully hardened.  Your cast or splint can be protected during bathing with a plastic bag sealed to your skin with medical tape. Do not lower the cast or splint into water.  Take medicines as directed by your health care provider. Only take over-the-counter or prescription medicines for pain, discomfort, or fever as directed by your health care provider.  Do not drive a vehicle until your health care provider specifically tells you it is safe to do so.  If your health care provider has given you a follow-up appointment, it is very important to keep that appointment. Not keeping the appointment could result in a chronic or permanent injury, pain, and disability. If you have any problem keeping the appointment, call the facility for assistance. SEEK MEDICAL CARE IF: You develop increased swelling or discomfort. SEEK IMMEDIATE MEDICAL CARE IF:   Your cast gets damaged or breaks.  You have continued severe pain.  You develop new pain or swelling after the cast was put on.  Your skin or toenails below the injury turn blue or gray.  Your skin or toenails below the injury feel cold, numb, or have loss of sensitivity to touch.  There is a bad smell or pus draining from under the cast. MAKE  SURE YOU:   Understand these instructions.  Will watch your condition.  Will get help right away if you are not doing well or get worse. Document Released: 06/07/2000 Document Revised: 06/15/2013 Document Reviewed: 01/07/2013 [name] Patient Information 2015 [place], [place]. This information is not intended to replace advice given to you by your health care provider. Make sure you discuss any questions you have with your health care provider.    Cast or Splint Care Casts and splints support injured limbs and keep bones from moving while they heal. It is important to care for your cast or splint at home.  HOME CARE INSTRUCTIONS  Keep the cast or splint uncovered during the drying period. It can take 24 to 48 hours to dry if it is made of plaster. A fiberglass cast will dry in  less than 1 hour.  Do not rest the cast on anything harder than a pillow for the first 24 hours.  Do not put weight on your injured limb or apply pressure to the cast until your health care provider gives you permission.  Keep the cast or splint dry. Wet casts or splints can lose their shape and may not support the limb as well. A wet cast that has lost its shape can also create harmful pressure on your skin when it dries. Also, wet skin can become infected.  Cover the cast or splint with a plastic bag when bathing or when out in the rain or snow. If the cast is on the trunk of the body, take sponge baths until the cast is removed.  If your cast does become wet, dry it with a towel or a blow dryer on the cool setting only.  Keep your cast or splint clean. Soiled casts may be wiped with a moistened cloth.  Do not place any hard or soft foreign objects under your cast or splint, such as cotton, toilet paper, lotion, or powder.  Do not try to scratch the skin under the cast with any object. The object could get stuck inside the cast. Also, scratching could lead to an infection. If itching is a problem, use a blow  dryer on a cool setting to relieve discomfort.  Do not trim or cut your cast or remove padding from inside of it.  Exercise all joints next to the injury that are not immobilized by the cast or splint. For example, if you have a long leg cast, exercise the hip joint and toes. If you have an arm cast or splint, exercise the shoulder, elbow, thumb, and fingers.  Elevate your injured arm or leg on 1 or 2 pillows for the first 1 to 3 days to decrease swelling and pain.It is best if you can comfortably elevate your cast so it is higher than your heart. SEEK MEDICAL CARE IF:   Your cast or splint cracks.  Your cast or splint is too tight or too loose.  You have unbearable itching inside the cast.  Your cast becomes wet or develops a soft spot or area.  You have a bad smell coming from inside your cast.  You get an object stuck under your cast.  Your skin around the cast becomes red or raw.  You have new pain or worsening pain after the cast has been applied. SEEK IMMEDIATE MEDICAL CARE IF:  4. You have fluid leaking through the cast. 5. You are unable to move your fingers or toes. 6. You have discolored (blue or white), cool, painful, or very swollen fingers or toes beyond the cast. 7. You have tingling or numbness around the injured area. 8. You have severe pain or pressure under the cast. 9. You have any difficulty with your breathing or have shortness of breath. 10. You have chest pain. Document Released: 06/07/2000 Document Revised: 03/31/2013 Document Reviewed: 12/17/2012 [facility] Patient Information 2015 [place], [place]. This information is not intended to replace advice given to you by your health care provider. Make sure you discuss any questions you have with your health care provider.   Walker Use HOW TO TELL IF A WALKER IS THE RIGHT SIZE  With your arms hanging at your sides, the walker handles should be at wrist level. If you cannot find the exact fit, choose the height  that is most comfortable.  If you have been instructed to  not place weight on one of your legs, you may feel more comfortable with a shorter height. If you are using the walker for balance, you may prefer a taller height.  Adjust the height by using the push buttons on the legs of your walker.  In rest position, the back leg of the walkers should be no further ahead than your toes. With your hands resting on the grips, your elbows should be slightly bent at about a 30 degree angle.  Ask your physical therapist or caregiver if you have any concerns. HOW TO USE A STANDARD WALKER (NO WHEELS)  Pick your walker up (do not slide your walker) and place it one step length in front of you. The back legs of the walker should be no further ahead than your toes. You should not feel like you need to lean forward to keep your hands on the grips. As you set the walker down, make sure all 4 leg tips contact the ground at the same time.  Hold onto the walker for support and step forward with your weaker leg into the middle of the walker. Follow the weight bearing instructions your caregiver has given you.  Push down with your hands and step forward with your stronger leg.  Be careful not to let the walker get too far ahead of you as you walk.  Repeat the process for each step. HOW TO USE A FRONT-WHEELED WALKER 11. Slide your walker forward. The back legs of the walker should be no further ahead than your toes. You should not feel like you need to lean forward to keep your hands on the grips. 12. Hold onto the walker for support and step forward with your weaker leg into the middle of the walker. Follow the weight bearing instructions your caregiver has given you. 13. Push down with your hands and step forward with your stronger leg. 14. Be careful not to let the walker get too far ahead of you as you walk. 15. Repeat the process for each step. 16. If your walker does not glide well over carpet, consider  cutting an "x" in 2 old tennis balls and placing them over the back legs of your walker. STANDING UP FROM A CHAIR WITH ARMRESTS  It is best to sit in a firm chair with armrests.  Position your walker directly in front of your chair. Do not pull on the walker when standing up. It is too unstable to support weight when pulled on.  Slide forward in the chair, with your weaker leg ahead and stronger leg bent near the chair.  Lean forward and push up from your chair with both hands on the armrests. Straighten your stronger leg, rising to standing. Do not pull yourself up from the walker. This may cause it to tip.  When you feel steady on your feet, carefully move one hand at a time to the walker.  Stand for a few seconds to stabilize your balance before you start to walk. STANDING UP FROM A CHAIR WITHOUT ARMRESTS  It is best to sit in a firm chair. A low seat or an overstuffed chair or sofa is hard to get out of.  Place the walker in front of you. Do not pull on the walker when coming to a standing position.  Slide forward in the chair, with your weaker leg ahead and stronger leg bent near the chair.  Push down on the chair seat with the hand opposite your weaker leg. Keep your  other hand on the center of the walker's crossbar.  Stand, steady your balance, and place your hands on the walker handgrips. SITTING DOWN  Always back up toward your chair, using your walker, until you feel the back of your legs touch the chair.  If the chair has armrests, carefully reach back to put your hands on the armrests, and slowly lower your weight.  If the chair does not have armrests, consider backing up to the side of the chair. You can then hold onto the back of the chair and the front of the seat to slowly lower yourself.  You should never feel like you are falling into your chair. USING A WALKER ON STEPS   Before attempting to use your walker on steps, practice with your physical therapist.  If  you are going up a step wide enough to accommodate the entire walker and yourself:  First, place the walker up on the step.  Second, get your feet as close to the step as you can.  Third, press down on the walker with your hands as you step up with your stronger leg. Then step up with your weaker leg.  If you are going down a step wide enough to accommodate the entire walker and yourself:  First, place the walker down on the step.  Second, hold onto the walker as you step down with your weaker leg. Then step down with your stronger leg.  If you are going up more than 1 step and have a railing:  First, turn the walker sideways, so the opening is facing in toward you.  Second, place the front 2 legs of the walker on the first step. These front legs should be positioned at the base of the next step.  Third, test the steadiness of the walker. It should feel sturdy when you press down on the handgrip that is facing the top of the steps.  Finally, placing your weight on the railing and the walker, step up with your stronger leg first. Then step up with your weaker leg.  If you are going down more than 1 step and have a railing:  First, turn the walker sideways, so the opening is facing in toward you.  Second, place the front 2 legs of the walker down on the first step. When possible, the back legs of the walker should be positioned at the base of the previous step.  Third, test the steadiness of the walker. It should feel sturdy when you press down on the handgrip that is facing the top of the steps.  Finally, placing your weight on the railing and the walker, step down with your weaker leg first. Then step down with your stronger leg.  Be sure to check the sturdiness of the walker before each step.  Make sure you have good rubber tips on the legs of your walker to prevent it from slipping. Document Released: 06/10/2005 Document Revised: 09/02/2011 Document Reviewed:  12/18/2010 [facility] Patient Information 2015 [place], [place]. This information is not intended to replace advice given to you by your health care provider. Make sure you discuss any questions you have with your health care provider.    Sternal Fracture The sternum is the bone in the center of the front of your chest which your ribs attach to. It is also called the breastbone. The most common cause of a sternal fracture (break in the bone) is an injury. The most common injury is from a motor vehicle accident. The  fracture often comes from the seat belt or hitting the chest on the steering wheel or being forcibly bent forward (shoulders toward your knees) during an accident. It is more common in females and the elderly. The fracture of the sternum is usually not a problem if there are no other injuries. Other injuries that may happen are to the ribs, heart, lungs, and abdominal organs. SYMPTOMS  Common complaints from a fracture of the sternum include:  Shortness of breath.  Pain with breathing or difficulty breathing.  Bruises about the chest.  Tenderness or a cracking sound at the breastbone. DIAGNOSIS  Your caregiver may be able to tell if the sternum is broken by examining you. Other times studies such as X-ray, CAT scan, ultrasound, and nuclear medicine are used to detect a fracture.  TREATMENT   Sternal fractures usually are not serious and if displacement is minimal, no treatment is necessary.  The main concern is with damage to the surrounding structures: ribs, heart, great vessels coming from the heart, and the backbone in the chest area.  Multiple rib fractures may cause breathing difficulties.  Injury to one of the large vessels in the chest may be a threat to life and require immediate surgery.  If injury to the heart or lungs is suspected it may be necessary to stay in the hospital and be monitored.  Other injuries will be treated as needed.  If the pieces of the  breastbone are out of normal position, they may need to be reduced (put back in position) and then wired in place or fixed with a plate and screws during an operation. HOME CARE INSTRUCTIONS  17. Avoid strenuous activity. Be careful during activities and avoid bumping or reinjuring the injured sternum. Activities that cause pain pull on the fracture site(s) and are best avoided if possible. 18. Eat a normal, well-balanced diet. Drink plenty of fluids to avoid constipation, a common side effect of pain medications. 19. Take deep breaths and cough several times a day, splinting the injured area with a pillow. This will help prevent pneumonia. 20. Do not wear a rib belt or binder for the chest unless instructed otherwise. These restrict breathing and can lead to pneumonia. 21. Only take over-the-counter or prescription medicines for pain, discomfort, or fever as directed by your caregiver. SEEK MEDICAL CARE IF:  You develop a continual cough, associated with thick or bloody mucus or phlegm (sputum). SEEK IMMEDIATE MEDICAL CARE IF:   You have a fever.  You have increasing difficulty breathing.  You feel sick to your stomach (nausea), vomit, or have abdominal pain.  You have worsening pain, not controlled with medications.  You develop pain in the tops of your shoulders (in the shoulder strap area).  You feel light-headed or faint.  You develop chest pain or an abnormal heartbeat (palpitations).  You develop pain radiating into the jaw, teeth or down the arms. Document Released: 01/23/2004 Document Revised: 10/25/2013 Document Reviewed: 09/12/2008 [facility] Patient Information 2015 [place], [place]. This information is not intended to replace advice given to you by your health care provider. Make sure you discuss any questions you have with your health care provider.    Cryotherapy Cryotherapy means treatment with cold. Ice or gel packs can be used to reduce both pain and swelling. Ice is  the most helpful within the first 24 to 48 hours after an injury or flare-up from overusing a muscle or joint. Sprains, strains, spasms, burning pain, shooting pain, and aches can all be  eased with ice. Ice can also be used when recovering from surgery. Ice is effective, has very few side effects, and is safe for most people to use. PRECAUTIONS  Ice is not a safe treatment option for people with:  Raynaud phenomenon. This is a condition affecting small blood vessels in the extremities. Exposure to cold may cause your problems to return.  Cold hypersensitivity. There are many forms of cold hypersensitivity, including:  Cold urticaria. Red, itchy hives appear on the skin when the tissues begin to warm after being iced.  Cold erythema. This is a red, itchy rash caused by exposure to cold.  Cold hemoglobinuria. Red blood cells break down when the tissues begin to warm after being iced. The hemoglobin that carry oxygen are passed into the urine because they cannot combine with blood proteins fast enough.  Numbness or altered sensitivity in the area being iced. If you have any of the following conditions, do not use ice until you have discussed cryotherapy with your caregiver:  Heart conditions, such as arrhythmia, angina, or chronic heart disease.  High blood pressure.  Healing wounds or open skin in the area being iced.  Current infections.  Rheumatoid arthritis.  Poor circulation.  Diabetes. Ice slows the blood flow in the region it is applied. This is beneficial when trying to stop inflamed tissues from spreading irritating chemicals to surrounding tissues. However, if you expose your skin to cold temperatures for too long or without the proper protection, you can damage your skin or nerves. Watch for signs of skin damage due to cold. HOME CARE INSTRUCTIONS Follow these tips to use ice and cold packs safely. 22. Place a dry or damp towel between the ice and skin. A damp towel will cool  the skin more quickly, so you may need to shorten the time that the ice is used. 23. For a more rapid response, add gentle compression to the ice. 24. Ice for no more than 10 to 20 minutes at a time. The bonier the area you are icing, the less time it will take to get the benefits of ice. 25. Check your skin after 5 minutes to make sure there are no signs of a poor response to cold or skin damage. 26. Rest 20 minutes or more between uses. 27. Once your skin is numb, you can end your treatment. You can test numbness by very lightly touching your skin. The touch should be so light that you do not see the skin dimple from the pressure of your fingertip. When using ice, most people will feel these normal sensations in this order: cold, burning, aching, and numbness. 28. Do not use ice on someone who cannot communicate their responses to pain, such as small children or people with dementia. HOW TO MAKE AN ICE PACK Ice packs are the most common way to use ice therapy. Other methods include ice massage, ice baths, and cryosprays. Muscle creams that cause a cold, tingly feeling do not offer the same benefits that ice offers and should not be used as a substitute unless recommended by your caregiver. To make an ice pack, do one of the following:  Place crushed ice or a bag of frozen vegetables in a sealable plastic bag. Squeeze out the excess air. Place this bag inside another plastic bag. Slide the bag into a pillowcase or place a damp towel between your skin and the bag.  Mix 3 parts water with 1 part rubbing alcohol. Freeze the mixture in a sealable  plastic bag. When you remove the mixture from the freezer, it will be slushy. Squeeze out the excess air. Place this bag inside another plastic bag. Slide the bag into a pillowcase or place a damp towel between your skin and the bag. SEEK MEDICAL CARE IF:  You develop white spots on your skin. This may give the skin a blotchy (mottled) appearance.  Your skin  turns blue or pale.  Your skin becomes waxy or hard.  Your swelling gets worse. MAKE SURE YOU:   Understand these instructions.  Will watch your condition.  Will get help right away if you are not doing well or get worse. Document Released: 02/04/2011 Document Revised: 10/25/2013 Document Reviewed: 02/04/2011 [facility] Patient Information 2015 [place], [place]. This information is not intended to replace advice given to you by your health care provider. Make sure you discuss any questions you have with your health care provider.    Incentive Spirometer An incentive spirometer is a tool that can help keep your lungs clear and active. This tool measures how well you are filling your lungs with each breath. Taking long, deep breaths may help reverse or decrease the chance of developing breathing (pulmonary) problems (especially infection) following:  Surgery of the chest or abdomen.  Surgery if you have a history of smoking or a lung problem.  A long period of time when you are unable to move or be active. BEFORE THE PROCEDURE   If the spirometer includes an indicator to show your best effort, your nurse or respiratory therapist will set it to a desired goal.  If possible, sit up straight or lean slightly forward. Try not to slouch.  Hold the incentive spirometer in an upright position. INSTRUCTIONS FOR USE  29. Sit on the edge of your bed if possible, or sit up as far as you can in bed or on a chair. 30. Hold the incentive spirometer in an upright position. 31. Breathe out normally. 32. Place the mouthpiece in your mouth and seal your lips tightly around it. 33. Breathe in slowly and as deeply as possible, raising the piston or the ball toward the top of the column. 34. Hold your breath for 3-5 seconds or for as long as possible. Allow the piston or ball to fall to the bottom of the column. 35. Remove the mouthpiece from your mouth and breathe out normally. 36. Rest for a few  seconds and repeat Steps 1 through 7 at least 10 times every 1-2 hours when you are awake. Take your time and take a few normal breaths between deep breaths. 37. The spirometer may include an indicator to show your best effort. Use the indicator as a goal to work toward during each repetition. 38. After each set of 10 deep breaths, practice coughing to be sure your lungs are clear. If you have an incision (the cut made at the time of surgery), support your incision when coughing by placing a pillow or rolled-up towels firmly against it. Once you are able to get out of bed, walk around indoors and cough well. You may stop using the incentive spirometer when instructed by your caregiver.  RISKS AND COMPLICATIONS  Breathing too quickly may cause dizziness. At an extreme, this could cause you to pass out. Take your time so you do not get dizzy or light-headed.  If you are in pain, you may need to take or ask for pain medication before doing incentive spirometry. It is harder to take a deep breath if  you are having pain. AFTER USE  Rest and breathe slowly and easily.  It can be helpful to keep a log of your progress. Your caregiver can provide you with a simple table to help with this. If you are using the spirometer at home, follow these instructions: [place] IF:   You are having difficultly using the spirometer.  You have trouble using the spirometer as often as instructed.  Your pain medication is not giving enough relief while using the spirometer.  You develop fever of 100.59F (38.1C) or higher. SEEK IMMEDIATE MEDICAL CARE IF:   You cough up bloody sputum that had not been present before.  You develop fever of 102F (38.9C) or greater.  You develop worsening pain at or near the incision site. MAKE SURE YOU:   Understand these instructions.  Will watch your condition.  Will get help right away if you are not doing well or get worse. Document Released: 10/21/2006  Document Revised: 10/25/2013 Document Reviewed: 12/22/2006 [facility] Patient Information 2015 [place], [place]. This information is not intended to replace advice given to you by your health care provider. Make sure you discuss any questions you have with your health care provider.

## 2014-11-18 NOTE — Progress Notes (Signed)
Applied fiOrthopedic Tech Progress Note Patient Details:  [name] Jan 15, 1935 [id number] Applied fiberglass posterior splint and fiberglass stirrup splint to LLE.  Pulses, sensation, motion intact before and after splinting.  Capillary refill less than 2 seconds before and after splinting. Ortho Devices Type of Ortho Device: Post (short leg) splint, Stirrup splint Ortho Device/Splint Location: LLE Ortho Device/Splint Interventions: Application   Darrol Poke 11/18/2014, 9:56 PM

## 2014-11-18 NOTE — ED Notes (Signed)
The pt  Is c/o pain in her lt anjle and her chest if she moves she still insists that she does not need anything for pain

## 2014-11-23 DIAGNOSIS — M25572 Pain in left ankle and joints of left foot: Secondary | ICD-10-CM | POA: Diagnosis not present

## 2014-11-23 DIAGNOSIS — S82845D Nondisplaced bimalleolar fracture of left lower leg, subsequent encounter for closed fracture with routine healing: Secondary | ICD-10-CM | POA: Diagnosis not present

## 2014-11-23 DIAGNOSIS — S82845A Nondisplaced bimalleolar fracture of left lower leg, initial encounter for closed fracture: Secondary | ICD-10-CM | POA: Diagnosis not present

## 2014-12-07 DIAGNOSIS — S82845D Nondisplaced bimalleolar fracture of left lower leg, subsequent encounter for closed fracture with routine healing: Secondary | ICD-10-CM | POA: Diagnosis not present

## 2014-12-07 DIAGNOSIS — S82845A Nondisplaced bimalleolar fracture of left lower leg, initial encounter for closed fracture: Secondary | ICD-10-CM | POA: Diagnosis not present

## 2014-12-07 DIAGNOSIS — M25572 Pain in left ankle and joints of left foot: Secondary | ICD-10-CM | POA: Diagnosis not present

## 2015-01-04 DIAGNOSIS — S82845D Nondisplaced bimalleolar fracture of left lower leg, subsequent encounter for closed fracture with routine healing: Secondary | ICD-10-CM | POA: Diagnosis not present

## 2015-02-01 DIAGNOSIS — S82845D Nondisplaced bimalleolar fracture of left lower leg, subsequent encounter for closed fracture with routine healing: Secondary | ICD-10-CM | POA: Diagnosis not present

## 2015-02-16 DIAGNOSIS — Z6820 Body mass index (BMI) 20.0-20.9, adult: Secondary | ICD-10-CM | POA: Diagnosis not present

## 2015-02-16 DIAGNOSIS — R05 Cough: Secondary | ICD-10-CM | POA: Diagnosis not present

## 2015-06-30 ENCOUNTER — Other Ambulatory Visit (HOSPITAL_COMMUNITY): Payer: Self-pay | Admitting: Internal Medicine

## 2015-06-30 DIAGNOSIS — M48061 Spinal stenosis, lumbar region without neurogenic claudication: Secondary | ICD-10-CM

## 2015-06-30 DIAGNOSIS — Z6820 Body mass index (BMI) 20.0-20.9, adult: Secondary | ICD-10-CM | POA: Diagnosis not present

## 2015-06-30 DIAGNOSIS — M4806 Spinal stenosis, lumbar region: Secondary | ICD-10-CM | POA: Diagnosis not present

## 2015-07-12 ENCOUNTER — Ambulatory Visit (HOSPITAL_COMMUNITY)
Admission: RE | Admit: 2015-07-12 | Discharge: 2015-07-12 | Disposition: A | Discharge status: Home / Self Care | Payer: Medicare Other | Source: Ambulatory Visit | Attending: Internal Medicine | Admitting: Internal Medicine

## 2015-07-12 DIAGNOSIS — M4806 Spinal stenosis, lumbar region: Secondary | ICD-10-CM | POA: Insufficient documentation

## 2015-07-12 DIAGNOSIS — M5127 Other intervertebral disc displacement, lumbosacral region: Secondary | ICD-10-CM | POA: Diagnosis not present

## 2015-07-12 DIAGNOSIS — M4690 Unspecified inflammatory spondylopathy, site unspecified: Secondary | ICD-10-CM | POA: Insufficient documentation

## 2015-07-12 DIAGNOSIS — M48061 Spinal stenosis, lumbar region without neurogenic claudication: Secondary | ICD-10-CM

## 2015-07-26 DIAGNOSIS — M4316 Spondylolisthesis, lumbar region: Secondary | ICD-10-CM | POA: Diagnosis not present

## 2015-07-26 DIAGNOSIS — M4806 Spinal stenosis, lumbar region: Secondary | ICD-10-CM | POA: Diagnosis not present

## 2015-07-26 DIAGNOSIS — M810 Age-related osteoporosis without current pathological fracture: Secondary | ICD-10-CM | POA: Diagnosis not present
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
## 2016-07-09 DIAGNOSIS — M25571 Pain in right ankle and joints of right foot: Secondary | ICD-10-CM | POA: Diagnosis not present

## 2016-07-09 DIAGNOSIS — M5137 Other intervertebral disc degeneration, lumbosacral region: Secondary | ICD-10-CM | POA: Diagnosis not present

## 2016-07-09 DIAGNOSIS — M25572 Pain in left ankle and joints of left foot: Secondary | ICD-10-CM | POA: Diagnosis not present

## 2016-11-09 DIAGNOSIS — S8011XA Contusion of right lower leg, initial encounter: Secondary | ICD-10-CM | POA: Diagnosis not present

## 2017-07-11 ENCOUNTER — Emergency Department (HOSPITAL_COMMUNITY)
Admission: EM | Admit: 2017-07-11 | Discharge: 2017-07-11 | Disposition: A | Discharge status: Home / Self Care | Payer: Medicare Other

## 2017-07-11 DIAGNOSIS — M810 Age-related osteoporosis without current pathological fracture: Secondary | ICD-10-CM | POA: Diagnosis not present

## 2017-07-11 DIAGNOSIS — M546 Pain in thoracic spine: Secondary | ICD-10-CM | POA: Diagnosis not present

## 2017-07-11 DIAGNOSIS — Z6820 Body mass index (BMI) 20.0-20.9, adult: Secondary | ICD-10-CM | POA: Diagnosis not present

## 2017-09-01 DIAGNOSIS — I10 Essential (primary) hypertension: Secondary | ICD-10-CM | POA: Diagnosis not present

## 2017-10-21 DIAGNOSIS — I10 Essential (primary) hypertension: Secondary | ICD-10-CM | POA: Diagnosis not present

## 2017-10-21 DIAGNOSIS — Z79899 Other long term (current) drug therapy: Secondary | ICD-10-CM | POA: Diagnosis not present

## 2017-10-28 DIAGNOSIS — E785 Hyperlipidemia, unspecified: Secondary | ICD-10-CM | POA: Diagnosis not present

## 2017-10-28 DIAGNOSIS — R030 Elevated blood-pressure reading, without diagnosis of hypertension: Secondary | ICD-10-CM | POA: Diagnosis not present

## 2017-11-28 ENCOUNTER — Other Ambulatory Visit (HOSPITAL_COMMUNITY): Payer: Self-pay | Admitting: Internal Medicine

## 2017-11-28 ENCOUNTER — Ambulatory Visit (HOSPITAL_COMMUNITY)
Admission: RE | Admit: 2017-11-28 | Discharge: 2017-11-28 | Disposition: A | Discharge status: Home / Self Care | Payer: Medicare Other | Source: Ambulatory Visit | Attending: Internal Medicine | Admitting: Internal Medicine

## 2017-11-28 DIAGNOSIS — W19XXXA Unspecified fall, initial encounter: Secondary | ICD-10-CM | POA: Diagnosis not present

## 2017-11-28 DIAGNOSIS — R937 Abnormal findings on diagnostic imaging of other parts of musculoskeletal system: Secondary | ICD-10-CM | POA: Diagnosis not present

## 2017-11-28 DIAGNOSIS — S79911A Unspecified injury of right hip, initial encounter: Secondary | ICD-10-CM | POA: Diagnosis not present

## 2017-11-28 DIAGNOSIS — M25551 Pain in right hip: Secondary | ICD-10-CM | POA: Diagnosis not present

## 2017-11-28 DIAGNOSIS — M25552 Pain in left hip: Secondary | ICD-10-CM | POA: Diagnosis not present

## 2017-11-28 DIAGNOSIS — R102 Pelvic and perineal pain: Secondary | ICD-10-CM | POA: Insufficient documentation

## 2017-11-28 DIAGNOSIS — R52 Pain, unspecified: Secondary | ICD-10-CM

## 2017-11-28 DIAGNOSIS — S79912A Unspecified injury of left hip, initial encounter: Secondary | ICD-10-CM | POA: Diagnosis not present

## 2018-01-02 DIAGNOSIS — I10 Essential (primary) hypertension: Secondary | ICD-10-CM | POA: Diagnosis not present

## 2018-01-31 ENCOUNTER — Emergency Department (HOSPITAL_COMMUNITY)
Admission: EM | Admit: 2018-01-31 | Discharge: 2018-01-31 | Disposition: A | Discharge status: Home / Self Care | Payer: Medicare Other | Attending: Emergency Medicine | Admitting: Emergency Medicine

## 2018-01-31 ENCOUNTER — Encounter (HOSPITAL_COMMUNITY): Payer: Self-pay

## 2018-01-31 ENCOUNTER — Emergency Department (HOSPITAL_COMMUNITY): Payer: Medicare Other

## 2018-01-31 DIAGNOSIS — Z85828 Personal history of other malignant neoplasm of skin: Secondary | ICD-10-CM | POA: Diagnosis not present

## 2018-01-31 DIAGNOSIS — I10 Essential (primary) hypertension: Secondary | ICD-10-CM

## 2018-01-31 DIAGNOSIS — R51 Headache: Secondary | ICD-10-CM | POA: Insufficient documentation

## 2018-01-31 DIAGNOSIS — R519 Headache, unspecified: Secondary | ICD-10-CM

## 2018-01-31 DIAGNOSIS — Z7982 Long term (current) use of aspirin: Secondary | ICD-10-CM | POA: Insufficient documentation

## 2018-01-31 MED ORDER — METOCLOPRAMIDE HCL 5 MG/ML IJ SOLN
10.0000 mg | Freq: Once | INTRAMUSCULAR | Status: AC
  Administered 2018-01-31: 10 mg via INTRAVENOUS
  Filled 2018-01-31: qty 2

## 2018-01-31 MED ORDER — KETOROLAC TROMETHAMINE 30 MG/ML IJ SOLN
30.0000 mg | Freq: Once | INTRAMUSCULAR | Status: AC
  Administered 2018-01-31: 30 mg via INTRAVENOUS
  Filled 2018-01-31: qty 1

## 2018-01-31 MED ORDER — SODIUM CHLORIDE 0.9 % IV SOLN
INTRAVENOUS | Status: DC
  Administered 2018-01-31: 09:00:00 via INTRAVENOUS

## 2018-01-31 NOTE — ED Provider Notes (Signed)
[facility] EMERGENCY DEPARTMENT Provider Note   CSN: [id number] Arrival date & time: 01/31/18  0550     History   Chief Complaint Chief Complaint  Patient presents with  . Headache  . Hypertension    HPI [name] is a 83 y.o. female.  HPI  The patient is an 83 year old female, she has a known history of high blood pressure and takes lisinopril daily, the patient has infrequent headaches, she developed a headache this morning which she states is throbbing, pain in the bitemporal area as well as the back of the neck, this is severe, nothing seems to make it better or worse, she is able to move her head from side to side and denies any other neurologic complaints.  She has been able to walk this morning without any difficulties with balance.  She is accompanied by her daughter who is an additional historian and reports that she was outside in the yard mowing the yard and doing lots of yard work yesterday and overexerted herself.  The patient is especially nervous about this this morning because she had elevated blood pressure, 568 systolic, this is very unusual for her.  She has still been elevated this morning when she arrives to the ER.  The patient is concerned that she may have had a stroke as her husband recently passed away after having a stroke.  She did take an aspirin last night but the headache did not really get bad until this morning.  Past Medical History:  Diagnosis Date  . Basal cell carcinoma   . Chest pain   . Diverticulosis   . DJD (degenerative joint disease) of cervical spine   . GERD (gastroesophageal reflux disease)   . Head injury, closed   . Headache(784.0)   . Osteopenia   . Pneumothorax 2006  . PSVT (paroxysmal supraventricular tachycardia) ([place]) 2003    Patient Active Problem List   Diagnosis Date Noted  . Hypertension 05/14/2012  . GASTROESOPHAGEAL REFLUX DISEASE 07/19/2010  . CHEST PAIN 07/19/2010    Past Surgical History:    Procedure Laterality Date  . ABDOMINAL HYSTERECTOMY    . APPENDECTOMY    . COLONOSCOPY    . NISSEN FUNDOPLICATION    . TONSILLECTOMY       OB History   None      Home Medications    Prior to Admission medications   Medication Sig Start Date End Date Taking? Authorizing Provider  aspirin 81 MG tablet Take 81 mg by mouth daily.   Yes [provider]  lisinopril (PRINIVIL,ZESTRIL) 20 MG tablet Take 20 mg by mouth daily.   Yes [provider]  naproxen sodium (ANAPROX) 220 MG tablet Take 220 mg by mouth daily as needed. For pain   Yes [provider]    Family History Family History  Problem Relation Age of Onset  . Arthritis Mother   . Vasculitis Mother   . Heart disease Father   . Parkinsonism Father   . Arthritis Sister   . Kidney failure Brother   . Asthma Sister   . Heart disease Sister     Social History Social History   Tobacco Use  . Smoking status: Never Smoker  . Smokeless tobacco: Never Used  Substance Use Topics  . Alcohol use: No  . Drug use: No     Allergies   Glucose; Morphine; Sulfonamide derivatives; Vancomycin; Wheat bran; and Penicillins   Review of Systems Review of Systems  All other systems  reviewed and are negative.    Physical Exam Updated Vital Signs BP (!) 112/101   Pulse (!) 58   Temp 98.2 F (36.8 C) (Oral)   Resp 17   Ht 5' (1.524 m)   Wt 49.9 kg   SpO2 98%   BMI 21.48 kg/m   Physical Exam  Constitutional: She appears well-developed and well-nourished. No distress.  HENT:  Head: Normocephalic and atraumatic.  Mouth/Throat: Oropharynx is clear and moist. No oropharyngeal exudate.  There is no tenderness over the scalp or the head or the posterior cervical spine  Eyes: Pupils are equal, round, and reactive to light. Conjunctivae and EOM are normal. Right eye exhibits no discharge. Left eye exhibits no discharge. No scleral icterus.  Neck: Normal range of motion. Neck supple. No JVD  present. No thyromegaly present.  Cardiovascular: Normal rate, regular rhythm, normal heart sounds and intact distal pulses. Exam reveals no gallop and no friction rub.  No murmur heard. Pulmonary/Chest: Effort normal and breath sounds normal. No respiratory distress. She has no wheezes. She has no rales.  Abdominal: Soft. Bowel sounds are normal. She exhibits no distension and no mass. There is no tenderness.  Musculoskeletal: Normal range of motion. She exhibits no edema or tenderness.  Lymphadenopathy:    She has no cervical adenopathy.  Neurological: She is alert. Coordination normal.  Speech is clear, cranial nerves III through XII are intact, memory is intact, strength is normal in all 4 extremities including grips, sensation is intact to light touch and pinprick in all 4 extremities. Coordination as tested by finger-nose-finger is normal, no limb ataxia. Normal gait, normal reflexes at the patellar tendons bilaterally  Skin: Skin is warm and dry. No rash noted. No erythema.  Psychiatric: She has a normal mood and affect. Her behavior is normal.  Nursing note and vitals reviewed.    ED Treatments / Results  Labs (all labs ordered are listed, but only abnormal results are displayed) Labs Reviewed - No data to display  EKG EKG Interpretation  Date/Time:  Saturday January 31 2018 06:01:53 EDT Ventricular Rate:  57 PR Interval:    QRS Duration: 152 QT Interval:  417 QTC Calculation: 406 R Axis:   66 Text Interpretation:  Sinus bradycardia Short PR interval Nonspecific intraventricular conduction delay Electrode noise Since last tracing rate slower 18 Nov 2014 Confirmed by Rolland Porter [phone]) on 01/31/2018 6:16:19 AM   Radiology Ct Head Wo Contrast  Result Date: 01/31/2018 CLINICAL DATA:  Acute severe headaches EXAM: CT HEAD WITHOUT CONTRAST TECHNIQUE: Contiguous axial images were obtained from the base of the skull through the vertex without intravenous contrast. COMPARISON:  MR  brain 08/19/2005 FINDINGS: Brain: No evidence of acute infarction, hemorrhage, extra-axial collection, ventriculomegaly, or mass effect. Old right basal ganglia lacunar infarct. Generalized cerebral atrophy. Periventricular white matter low attenuation likely secondary to microangiopathy. Vascular: Cerebrovascular atherosclerotic calcifications are noted. Skull: Negative for fracture or focal lesion. Sinuses/Orbits: Visualized portions of the orbits are unremarkable. Visualized portions of the paranasal sinuses and mastoid air cells are unremarkable. Other: None. IMPRESSION: 1. No acute intracranial pathology. 2. Chronic microvascular disease and cerebral atrophy. Electronically Signed   By: Kathreen Devoid   On: 01/31/2018 08:33    Procedures Procedures (including critical care time)  Medications Ordered in ED Medications  0.9 %  sodium chloride infusion ( Intravenous New Bag/Given 01/31/18 0835)  ketorolac (TORADOL) 30 MG/ML injection 30 mg (30 mg Intravenous Given 01/31/18 0835)  metoCLOPramide (REGLAN) injection 10 mg (10 mg  Intravenous Given 01/31/18 0835)     Initial Impression / Assessment and Plan / ED Course  I have reviewed the triage vital signs and the nursing notes.  Pertinent labs & imaging results that were available during my care of the patient were reviewed by me and considered in my medical decision making (see chart for details).  Clinical Course as of Feb 01 1019  Sat Jan 31, 2018  3267 I have personally viewed and interpreted the CT scan of the brain showing no signs of acute ischemia hemorrhage or fracture of the skull.   [BM]  [phone] Will treat headache with ibuprofen and dose of Reglan.   [BM]  1019 The patient was rechecked, she feels much better after receiving IV fluids, pain medications, blood pressure rechecked and systolic has improved significantly.  Heart rate remains around 60, mental status is normal, patient is ambulate back and forth to the bathroom, stable for  discharge, she is requesting discharge and states that her headache is almost completely gone.   [BM]  1019 She is agreeable to return should her symptoms worsen.   [BM]    Clinical Course User Index [BM] Noemi Chapel, MD    The patient's exam is unremarkable, she is having a headache And is concerned that she may be having a stroke.  Her blood pressure is indeed elevated however at 170/93 it is much less concerning.  Will obtain a CT scan of the brain, pain medication, patient otherwise appears unremarkable.  Final Clinical Impressions(s) / ED Diagnoses   Final diagnoses:  Bad headache  Essential hypertension    ED Discharge Orders    None      Noemi Chapel, MD 01/31/18 1020

## 2018-01-31 NOTE — ED Triage Notes (Signed)
Pt states she awoke this am with pain to the back of her head and neck.  Pt states her blood pressure was elevated at home.  Pt had worked outside a long time yesterday and her daughter feels she over exerted herself.

## 2018-01-31 NOTE — Discharge Instructions (Signed)
Your CT scan shows no signs of bleeding or stroke.  Please take ibuprofen, 400 mg every 8 hours as needed for headache, drink plenty of fluids, stay out of the heat.  Return to the emergency department for severe or worsening symptoms.  This would include increasing pain, vomiting, blurred vision, difficulty with speech or coordination, strength or sensation.

## 2018-02-12 DIAGNOSIS — H9201 Otalgia, right ear: Secondary | ICD-10-CM | POA: Diagnosis not present

## 2018-08-11 DIAGNOSIS — D2330 Other benign neoplasm of skin of unspecified part of face: Secondary | ICD-10-CM | POA: Diagnosis not present

## 2018-08-11 DIAGNOSIS — L570 Actinic keratosis: Secondary | ICD-10-CM | POA: Diagnosis not present

## 2018-09-11 ENCOUNTER — Other Ambulatory Visit (HOSPITAL_COMMUNITY): Payer: Self-pay | Admitting: Internal Medicine

## 2018-09-11 DIAGNOSIS — I10 Essential (primary) hypertension: Secondary | ICD-10-CM | POA: Diagnosis not present

## 2018-09-11 DIAGNOSIS — R413 Other amnesia: Secondary | ICD-10-CM | POA: Diagnosis not present

## 2018-09-11 DIAGNOSIS — Z79899 Other long term (current) drug therapy: Secondary | ICD-10-CM | POA: Diagnosis not present

## 2018-09-28 DIAGNOSIS — R413 Other amnesia: Secondary | ICD-10-CM | POA: Diagnosis not present

## 2018-09-28 DIAGNOSIS — R531 Weakness: Secondary | ICD-10-CM | POA: Diagnosis not present

## 2018-10-13 ENCOUNTER — Encounter (HOSPITAL_COMMUNITY): Payer: Self-pay

## 2018-10-13 ENCOUNTER — Ambulatory Visit (HOSPITAL_COMMUNITY): Payer: Medicare Other

## 2018-11-26 ENCOUNTER — Ambulatory Visit (HOSPITAL_COMMUNITY): Payer: Medicare Other | Attending: Internal Medicine

## 2018-12-28 ENCOUNTER — Telehealth: Payer: Self-pay

## 2018-12-28 ENCOUNTER — Telehealth: Payer: Self-pay | Admitting: General Practice

## 2018-12-28 ENCOUNTER — Other Ambulatory Visit: Payer: Medicare Other

## 2018-12-28 DIAGNOSIS — I10 Essential (primary) hypertension: Secondary | ICD-10-CM | POA: Diagnosis not present

## 2018-12-28 DIAGNOSIS — R413 Other amnesia: Secondary | ICD-10-CM | POA: Diagnosis not present

## 2018-12-28 DIAGNOSIS — R6889 Other general symptoms and signs: Secondary | ICD-10-CM | POA: Diagnosis not present

## 2018-12-28 DIAGNOSIS — Z20822 Contact with and (suspected) exposure to covid-19: Secondary | ICD-10-CM

## 2018-12-28 NOTE — Telephone Encounter (Signed)
[name] from Dr Asencion Noble is requesting covid 19 testing for pt.Her contact number is [phone]

## 2018-12-28 NOTE — Telephone Encounter (Signed)
Dr. Willey Blade requests COVID 19 test for exposure. Pt. Reports she went to the community test site this morning and had it done.

## 2018-12-28 NOTE — Telephone Encounter (Signed)
Spoke with patient, scheduled her for COVID 19 test today at 12:30 pm at [facility].  Testing protocol reviewed with patient.

## 2019-01-02 LAB — NOVEL CORONAVIRUS, NAA: SARS-CoV-2, NAA: NOT DETECTED

## 2019-01-28 ENCOUNTER — Other Ambulatory Visit (HOSPITAL_COMMUNITY): Payer: Self-pay | Admitting: Internal Medicine

## 2019-01-28 ENCOUNTER — Ambulatory Visit (HOSPITAL_COMMUNITY)
Admission: RE | Admit: 2019-01-28 | Discharge: 2019-01-28 | Disposition: A | Discharge status: Home / Self Care | Payer: Medicare Other | Source: Ambulatory Visit | Attending: Internal Medicine | Admitting: Internal Medicine

## 2019-01-28 ENCOUNTER — Other Ambulatory Visit: Payer: Self-pay

## 2019-01-28 DIAGNOSIS — M545 Low back pain, unspecified: Secondary | ICD-10-CM

## 2019-02-05 ENCOUNTER — Encounter (HOSPITAL_COMMUNITY): Payer: Self-pay | Admitting: Emergency Medicine

## 2019-02-05 ENCOUNTER — Emergency Department (HOSPITAL_COMMUNITY): Payer: Medicare Other

## 2019-02-05 ENCOUNTER — Emergency Department (HOSPITAL_COMMUNITY)
Admission: EM | Admit: 2019-02-05 | Discharge: 2019-02-05 | Disposition: A | Discharge status: Home / Self Care | Payer: Medicare Other | Attending: Emergency Medicine | Admitting: Emergency Medicine

## 2019-02-05 ENCOUNTER — Other Ambulatory Visit: Payer: Self-pay

## 2019-02-05 DIAGNOSIS — Y92016 Swimming-pool in single-family (private) house or garden as the place of occurrence of the external cause: Secondary | ICD-10-CM | POA: Diagnosis not present

## 2019-02-05 DIAGNOSIS — I10 Essential (primary) hypertension: Secondary | ICD-10-CM | POA: Insufficient documentation

## 2019-02-05 DIAGNOSIS — Z85828 Personal history of other malignant neoplasm of skin: Secondary | ICD-10-CM | POA: Diagnosis not present

## 2019-02-05 DIAGNOSIS — Y93H2 Activity, gardening and landscaping: Secondary | ICD-10-CM | POA: Insufficient documentation

## 2019-02-05 DIAGNOSIS — Y999 Unspecified external cause status: Secondary | ICD-10-CM | POA: Diagnosis not present

## 2019-02-05 DIAGNOSIS — M79641 Pain in right hand: Secondary | ICD-10-CM | POA: Diagnosis not present

## 2019-02-05 DIAGNOSIS — Z23 Encounter for immunization: Secondary | ICD-10-CM | POA: Insufficient documentation

## 2019-02-05 DIAGNOSIS — W19XXXA Unspecified fall, initial encounter: Secondary | ICD-10-CM | POA: Insufficient documentation

## 2019-02-05 DIAGNOSIS — S61411A Laceration without foreign body of right hand, initial encounter: Secondary | ICD-10-CM | POA: Diagnosis not present

## 2019-02-05 DIAGNOSIS — Z79899 Other long term (current) drug therapy: Secondary | ICD-10-CM | POA: Diagnosis not present

## 2019-02-05 DIAGNOSIS — L089 Local infection of the skin and subcutaneous tissue, unspecified: Secondary | ICD-10-CM | POA: Diagnosis not present

## 2019-02-05 DIAGNOSIS — S6991XA Unspecified injury of right wrist, hand and finger(s), initial encounter: Secondary | ICD-10-CM | POA: Diagnosis not present

## 2019-02-05 MED ORDER — TETANUS-DIPHTH-ACELL PERTUSSIS 5-2.5-18.5 LF-MCG/0.5 IM SUSP
0.5000 mL | Freq: Once | INTRAMUSCULAR | Status: AC
  Administered 2019-02-05: 13:00:00 0.5 mL via INTRAMUSCULAR
  Filled 2019-02-05: qty 0.5

## 2019-02-05 MED ORDER — DOXYCYCLINE HYCLATE 100 MG PO CAPS: 100.0000 mg | ORAL_CAPSULE | Freq: Two times a day (BID) | ORAL | 0 refills | Status: DC

## 2019-02-05 NOTE — Discharge Instructions (Signed)
Return if any problems.

## 2019-02-05 NOTE — ED Triage Notes (Signed)
Patient fell yesterday in garden, injuring right hand, laceration noted, red and swollen.  Patient also fell last week, having right-side back pain from previous fall.

## 2019-02-05 NOTE — ED Provider Notes (Signed)
[facility] EMERGENCY DEPARTMENT Provider Note   CSN: [id number] Arrival date & time: 02/05/19  1159     History   Chief Complaint Chief Complaint  Patient presents with  . Fall    HPI [name] is a 84 y.o. female.     The history is provided by the patient. No language interpreter was used.  Fall This is a new problem. The current episode started yesterday. The problem occurs constantly. The problem has not changed since onset.Nothing aggravates the symptoms. Nothing relieves the symptoms. She has tried nothing for the symptoms. The treatment provided no relief.  Pt fell yesterday and cut her hand.  Pt also fell last week.  Pt reports she saw Dr. Willey Blade.  Pt had an xray of her ls spine.  Pt reports she did not hit back yesterday but she twisted causing previous area of injury to hurt.    Past Medical History:  Diagnosis Date  . Basal cell carcinoma   . Chest pain   . Diverticulosis   . DJD (degenerative joint disease) of cervical spine   . GERD (gastroesophageal reflux disease)   . Head injury, closed   . Headache(784.0)   . Osteopenia   . Pneumothorax 2006  . PSVT (paroxysmal supraventricular tachycardia) ([place]) 2003    Patient Active Problem List   Diagnosis Date Noted  . Hypertension 05/14/2012  . GASTROESOPHAGEAL REFLUX DISEASE 07/19/2010  . CHEST PAIN 07/19/2010    Past Surgical History:  Procedure Laterality Date  . ABDOMINAL HYSTERECTOMY    . APPENDECTOMY    . COLONOSCOPY    . NISSEN FUNDOPLICATION    . TONSILLECTOMY       OB History   No obstetric history on file.      Home Medications    Prior to Admission medications   Medication Sig Start Date End Date Taking? Authorizing Provider  amLODipine (NORVASC) 2.5 MG tablet Take 2.5 mg by mouth daily.   Yes [provider]  lisinopril (PRINIVIL,ZESTRIL) 20 MG tablet Take 20 mg by mouth daily.   Yes [provider]  naproxen sodium (ANAPROX) 220 MG tablet Take 220 mg  by mouth daily as needed. For pain   Yes [provider]  traMADol (ULTRAM) 50 MG tablet Take 50 mg by mouth every 6 (six) hours as needed.   Yes [provider]  doxycycline (VIBRAMYCIN) 100 MG capsule Take 1 capsule (100 mg total) by mouth 2 (two) times daily. 02/05/19   Fransico Meadow, PA-C    Family History Family History  Problem Relation Age of Onset  . Arthritis Mother   . Vasculitis Mother   . Heart disease Father   . Parkinsonism Father   . Arthritis Sister   . Kidney failure Brother   . Asthma Sister   . Heart disease Sister     Social History Social History   Tobacco Use  . Smoking status: Never Smoker  . Smokeless tobacco: Never Used  Substance Use Topics  . Alcohol use: No  . Drug use: No     Allergies   Glucose, Morphine, Sulfonamide derivatives, Vancomycin, Wheat bran, and Penicillins   Review of Systems Review of Systems  All other systems reviewed and are negative.    Physical Exam Updated Vital Signs BP (!) 147/80 (BP Location: Left Arm)   Pulse 75   Temp 98.2 F (36.8 C) (Oral)   Resp 16   Ht 4\' 9"  (1.448 m)   Wt 49.9 kg  SpO2 96%   BMI 23.80 kg/m   Physical Exam Vitals signs and nursing note reviewed.  Musculoskeletal:        General: Swelling and tenderness present.  Skin:    Comments: 1.5 cm laceration superficial 1cm laceration superficial,  Redness to hands swelling dorsal hand   nv and ns intact  Neurological:     General: No focal deficit present.  Psychiatric:        Mood and Affect: Mood normal.      ED Treatments / Results  Labs (all labs ordered are listed, but only abnormal results are displayed) Labs Reviewed - No data to display  EKG None  Radiology Dg Hand Complete Right  Result Date: 02/05/2019 CLINICAL DATA:  Recent fall with hand pain, initial encounter EXAM: RIGHT HAND - COMPLETE 3+ VIEW COMPARISON:  None. FINDINGS: Degenerative changes of the first [employer] joint are noted. No acute  fracture or dislocation is noted. No soft tissue abnormality is seen. IMPRESSION: Degenerative change without acute abnormality. Electronically Signed   By: Inez Catalina M.D.   On: 02/05/2019 13:23    Procedures Procedures (including critical care time)  An After Visit Summary was printed and given to the patient. Medications Ordered in ED Medications  Tdap (BOOSTRIX) injection 0.5 mL (0.5 mLs Intramuscular Given 02/05/19 1251)     Initial Impression / Assessment and Plan / ED Course  I have reviewed the triage vital signs and the nursing notes.  Pertinent labs & imaging results that were available during my care of the patient were reviewed by me and considered in my medical decision making (see chart for details).       MDM  Wound cleaned and bandaged.  Xray reviewed no acute abnormality Pt advised to recheck with her Md or urgent care on Monday  Final Clinical Impressions(s) / ED Diagnoses   Final diagnoses:  Laceration of right hand without foreign body, initial encounter  Skin infection    ED Discharge Orders         Ordered    doxycycline (VIBRAMYCIN) 100 MG capsule  2 times daily     02/05/19 1351           Fransico Meadow, [place] 02/05/19 1533    Lucrezia Starch, MD 02/06/19 1224

## 2019-02-08 DIAGNOSIS — L03113 Cellulitis of right upper limb: Secondary | ICD-10-CM | POA: Diagnosis not present

## 2019-02-12 DIAGNOSIS — Z23 Encounter for immunization: Secondary | ICD-10-CM | POA: Diagnosis not present

## 2019-02-25 DIAGNOSIS — N3000 Acute cystitis without hematuria: Secondary | ICD-10-CM | POA: Diagnosis not present

## 2019-03-03 ENCOUNTER — Ambulatory Visit (HOSPITAL_COMMUNITY): Payer: Medicare Other

## 2019-03-08 ENCOUNTER — Ambulatory Visit (HOSPITAL_COMMUNITY)
Admission: RE | Admit: 2019-03-08 | Discharge: 2019-03-08 | Disposition: A | Discharge status: Home / Self Care | Payer: Medicare Other | Source: Ambulatory Visit | Attending: Internal Medicine | Admitting: Internal Medicine

## 2019-03-08 ENCOUNTER — Other Ambulatory Visit: Payer: Self-pay

## 2019-03-08 DIAGNOSIS — R296 Repeated falls: Secondary | ICD-10-CM | POA: Diagnosis not present

## 2019-03-08 DIAGNOSIS — R413 Other amnesia: Secondary | ICD-10-CM | POA: Insufficient documentation

## 2019-03-12 DIAGNOSIS — Z8673 Personal history of transient ischemic attack (TIA), and cerebral infarction without residual deficits: Secondary | ICD-10-CM | POA: Diagnosis not present

## 2019-03-12 DIAGNOSIS — R413 Other amnesia: Secondary | ICD-10-CM | POA: Diagnosis not present

## 2019-03-12 DIAGNOSIS — I10 Essential (primary) hypertension: Secondary | ICD-10-CM | POA: Diagnosis not present

## 2020-01-05 DIAGNOSIS — I10 Essential (primary) hypertension: Secondary | ICD-10-CM | POA: Diagnosis not present

## 2020-01-05 DIAGNOSIS — R413 Other amnesia: Secondary | ICD-10-CM | POA: Diagnosis not present

## 2020-02-21 DIAGNOSIS — G9009 Other idiopathic peripheral autonomic neuropathy: Secondary | ICD-10-CM | POA: Diagnosis not present

## 2020-02-21 DIAGNOSIS — I10 Essential (primary) hypertension: Secondary | ICD-10-CM | POA: Diagnosis not present

## 2020-04-19 ENCOUNTER — Other Ambulatory Visit: Payer: Self-pay

## 2020-04-19 ENCOUNTER — Encounter: Payer: Self-pay | Admitting: Cardiovascular Disease

## 2020-04-19 ENCOUNTER — Ambulatory Visit: Payer: Medicare Other | Admitting: Cardiovascular Disease

## 2020-04-19 VITALS — BP 128/76 | HR 69 | Ht <= 58 in | Wt 111.8 lb

## 2020-04-19 DIAGNOSIS — I10 Essential (primary) hypertension: Secondary | ICD-10-CM

## 2020-04-19 NOTE — Patient Instructions (Signed)
Medication Instructions:  NO CHANGES *If you need a refill on your cardiac medications before your next appointment, please call your pharmacy*   Lab Work:  FASTING LIPID LIVER AND BMET [facility] If you have labs (blood work) drawn today and your tests are completely normal, you will receive your results only by: Marland Kitchen MyChart Message (if you have MyChart) OR . A paper copy in the mail If you have any lab test that is abnormal or we need to change your treatment, we will call you to review the results.   Testing/Procedures:  CALCIUM SCORE  OUT OF POCKET $150.00    Follow-Up: At [facility], you and your health needs are our priority.  As part of our continuing mission to provide you with exceptional heart care, we have created designated Provider Care Teams.  These Care Teams include your primary Cardiologist (physician) and Advanced Practice Providers (APPs -  Physician Assistants and Nurse Practitioners) who all work together to provide you with the care you need, when you need it.  We recommend signing up for the patient portal called "MyChart".  Sign up information is provided on this After Visit Summary.  MyChart is used to connect with patients for Virtual Visits (Telemedicine).  Patients are able to view lab/test results, encounter notes, upcoming appointments, etc.  Non-urgent messages can be sent to your provider as well.   To learn more about what you can do with MyChart, go to [email or web address].    Your next appointment:   6 month(s)  The format for your next appointment:   In Person  Provider:   You will see one of the following Advanced Practice Providers on your designated Care Team:    Richardson Dopp, PA-C  Vin [place], [place]     Other Instructions NONE

## 2020-04-19 NOTE — Progress Notes (Signed)
Cardiology Office Note:    Date:  04/19/2020   ID:  [name], DOB Oct 27, 1934, MRN [id number]  PCP:  Asencion Noble, MD  [facility] Cardiologist:  Wyvonne Lenz  [facility] Electrophysiologist:  None   Referring MD: Asencion Noble, MD   Chief Complaint  Patient presents with  . Tachycardia    Oct. 27, 2021   [name] is a 85 y.o. female with a hx of HTN, and PSVT .  We were asked to see her by Dr. Willey Blade for further evaluation and management of her HTN. Seen with daughter, [name]   She has episodes of variable BP .  She is on 2 BP meds.   Eats salt regularly   Some episodes of chest tightness, squeezing  Usually occurs when she is stressed . Lasts for 30 minutes or so . Typically she lies down to rest and the tightness improves.  Not related to exercise   Has some forgetfulness  No signs or symptoms of stroke    Past Medical History:  Diagnosis Date  . Basal cell carcinoma   . Chest pain   . Diverticulosis   . DJD (degenerative joint disease) of cervical spine   . GERD (gastroesophageal reflux disease)   . Head injury, closed   . Headache(784.0)   . Osteopenia   . Pneumothorax 2006  . PSVT (paroxysmal supraventricular tachycardia) ([place]) 2003    Past Surgical History:  Procedure Laterality Date  . ABDOMINAL HYSTERECTOMY    . APPENDECTOMY    . COLONOSCOPY    . NISSEN FUNDOPLICATION    . TONSILLECTOMY      Current Medications: Current Meds  Medication Sig  . amLODipine (NORVASC) 5 MG tablet Take 1 tablet by mouth daily.  Marland Kitchen gabapentin (NEURONTIN) 100 MG capsule Take 100 mg by mouth at bedtime.  Marland Kitchen lisinopril (PRINIVIL,ZESTRIL) 20 MG tablet Take 20 mg by mouth daily.  . naproxen sodium (ANAPROX) 220 MG tablet Take 220 mg by mouth daily as needed. For pain  . [DISCONTINUED] amLODipine (NORVASC) 2.5 MG tablet Take 2.5 mg by mouth daily.  . [DISCONTINUED] doxycycline (VIBRAMYCIN) 100 MG capsule Take 1 capsule (100 mg total) by mouth 2  (two) times daily.  . [DISCONTINUED] traMADol (ULTRAM) 50 MG tablet Take 50 mg by mouth every 6 (six) hours as needed.     Allergies:   Glucose, Morphine, Sulfonamide derivatives, Vancomycin, Wheat bran, and Penicillins   Social History   Socioeconomic History  . Marital status: Married    Spouse name: Liliane Shi  . Number of children: 4  . Years of education: College  . Highest education level: Not on file  Occupational History  . Occupation: [employer]    Employer: RETIRED  Tobacco Use  . Smoking status: Never Smoker  . Smokeless tobacco: Never Used  Substance and Sexual Activity  . Alcohol use: No  . Drug use: No  . Sexual activity: Not on file  Other Topics Concern  . Not on file  Social History Narrative   No regular exercise   Patient lives at home with spouse.   Caffeine Use:   Social Determinants of Health   Financial Resource Strain:   . Difficulty of Paying Living Expenses: Not on file  Food Insecurity:   . Worried About Charity fundraiser in the Last Year: Not on file  . Ran Out of Food in the Last Year: Not on file  Transportation Needs:   . Lack of Transportation (Medical): Not on  file  . Lack of Transportation (Non-Medical): Not on file  Physical Activity:   . Days of Exercise per Week: Not on file  . Minutes of Exercise per Session: Not on file  Stress:   . Feeling of Stress : Not on file  Social Connections:   . Frequency of Communication with Friends and Family: Not on file  . Frequency of Social Gatherings with Friends and Family: Not on file  . Attends Religious Services: Not on file  . Active Member of Clubs or Organizations: Not on file  . Attends Archivist Meetings: Not on file  . Marital Status: Not on file     Family History: The patient's family history includes Arthritis in her mother and sister; Asthma in her sister; Heart disease in her father and sister; Kidney failure in her brother; Parkinsonism in her father; Vasculitis in her  mother.  ROS:   Please see the history of present illness.     All other systems reviewed and are negative.  EKGs/Labs/Other Studies Reviewed:    The following studies were reviewed today:     Recent Labs: No results found for requested labs within last 8760 hours.  Recent Lipid Panel No results found for: CHOL, TRIG, HDL, CHOLHDL, VLDL, LDLCALC, LDLDIRECT   Risk Assessment/Calculations:       Physical Exam:    VS:  BP 128/76   Pulse 69   Ht 4\' 9"  (1.448 m)   Wt 111 lb 12.8 oz (50.7 kg)   SpO2 98%   BMI 24.19 kg/m     Wt Readings from Last 3 Encounters:  04/19/20 111 lb 12.8 oz (50.7 kg)  02/05/19 110 lb (49.9 kg)  01/31/18 110 lb (49.9 kg)     GEN:  Well nourished, well developed in no acute distress HEENT: Normal NECK: No JVD; No carotid bruits LYMPHATICS: No lymphadenopathy CARDIAC: RRR, no murmurs, rubs, gallops RESPIRATORY:  Clear to auscultation without rales, wheezing or rhonchi  ABDOMEN: Soft, non-tender, non-distended MUSCULOSKELETAL:  No edema; No deformity  SKIN: Warm and dry NEUROLOGIC:  Alert and oriented x 3 PSYCHIATRIC:  Normal affect   EKG:  Oct. 27, 2021: NSR .  NS ST / T wave abn.    ASSESSMENT:    1. Hypertension, unspecified type    PLAN:    In order of problems listed above:  1. HTN:    Well controlled here today .   Needs to watch her salt .   2.  Hyperlipidemia:   Daughter would like for her to get a coronary calcium score.   Will order  Will check lipids, liver, bmp   3.  Orthostatic hypotension:  We discussed possible causes.   She needs to eat and drink more regularly .   Will have him see an APP in 6 months   Medication Adjustments/Labs and Tests Ordered: Current medicines are reviewed at length with the patient today.  Concerns regarding medicines are outlined above.  Orders Placed This Encounter  Procedures  . CT CARDIAC SCORING  . Basic metabolic panel  . Lipid panel  . Hepatic function panel  . EKG 12-Lead    No orders of the defined types were placed in this encounter.   Patient Instructions  Medication Instructions:  NO CHANGES *If you need a refill on your cardiac medications before your next appointment, please call your pharmacy*   Lab Work:  FASTING LIPID LIVER AND BMET ANY [facility] If you have labs (blood work) drawn  today and your tests are completely normal, you will receive your results only by: Marland Kitchen MyChart Message (if you have MyChart) OR . A paper copy in the mail If you have any lab test that is abnormal or we need to change your treatment, we will call you to review the results.   Testing/Procedures:  CALCIUM SCORE  OUT OF POCKET $150.00    Follow-Up: At [facility], you and your health needs are our priority.  As part of our continuing mission to provide you with exceptional heart care, we have created designated Provider Care Teams.  These Care Teams include your primary Cardiologist (physician) and Advanced Practice Providers (APPs -  Physician Assistants and Nurse Practitioners) who all work together to provide you with the care you need, when you need it.  We recommend signing up for the patient portal called "MyChart".  Sign up information is provided on this After Visit Summary.  MyChart is used to connect with patients for Virtual Visits (Telemedicine).  Patients are able to view lab/test results, encounter notes, upcoming appointments, etc.  Non-urgent messages can be sent to your provider as well.   To learn more about what you can do with MyChart, go to [email or web address].    Your next appointment:   6 month(s)  The format for your next appointment:   In Person  Provider:   You will see one of the following Advanced Practice Providers on your designated Care Team:    Richardson Dopp, PA-C  Robbie Lis, [place]     Other Instructions NONE     Signed, Mertie Moores, MD  04/19/2020 5:40 PM    [place]

## 2020-04-20 ENCOUNTER — Other Ambulatory Visit: Payer: Self-pay

## 2020-04-20 DIAGNOSIS — I10 Essential (primary) hypertension: Secondary | ICD-10-CM

## 2020-04-20 DIAGNOSIS — N3000 Acute cystitis without hematuria: Secondary | ICD-10-CM | POA: Diagnosis not present

## 2020-04-20 LAB — BASIC METABOLIC PANEL
BUN/Creatinine Ratio: 19 (ref 12–28)
BUN: 14 mg/dL (ref 8–27)
CO2: 24 mmol/L (ref 20–29)
Calcium: 9 mg/dL (ref 8.7–10.3)
Chloride: 99 mmol/L (ref 96–106)
Creatinine, Ser: 0.72 mg/dL (ref 0.57–1.00)
GFR calc Af Amer: 90 mL/min/{1.73_m2} (ref >59)
GFR calc non Af Amer: 78 mL/min/{1.73_m2} (ref >59)
Glucose: 77 mg/dL (ref 65–99)
Potassium: 4.3 mmol/L (ref 3.5–5.2)
Sodium: 137 mmol/L (ref 134–144)

## 2020-04-21 LAB — HEPATIC FUNCTION PANEL
ALT: 13 IU/L (ref 0–32)
AST: 24 IU/L (ref 0–40)
Albumin: 4.6 g/dL (ref 3.6–4.6)
Alkaline Phosphatase: 82 IU/L (ref 44–121)
Bilirubin Total: 0.4 mg/dL (ref 0.0–1.2)
Bilirubin, Direct: 0.11 mg/dL (ref 0.00–0.40)
Total Protein: 6.9 g/dL (ref 6.0–8.5)

## 2020-04-21 LAB — LIPID PANEL
Chol/HDL Ratio: 3.1 ratio (ref 0.0–4.4)
Cholesterol, Total: 253 mg/dL — ABNORMAL HIGH (ref 100–199)
HDL: 82 mg/dL (ref >39)
LDL Chol Calc (NIH): 157 mg/dL — ABNORMAL HIGH (ref 0–99)
Triglycerides: 86 mg/dL (ref 0–149)
VLDL Cholesterol Cal: 14 mg/dL (ref 5–40)

## 2020-04-28 ENCOUNTER — Telehealth: Payer: Self-pay | Admitting: Cardiovascular Disease

## 2020-04-28 DIAGNOSIS — G3184 Mild cognitive impairment, so stated: Secondary | ICD-10-CM | POA: Diagnosis not present

## 2020-04-28 DIAGNOSIS — Z23 Encounter for immunization: Secondary | ICD-10-CM | POA: Diagnosis not present

## 2020-04-28 DIAGNOSIS — Z8673 Personal history of transient ischemic attack (TIA), and cerebral infarction without residual deficits: Secondary | ICD-10-CM | POA: Diagnosis not present

## 2020-04-28 DIAGNOSIS — E785 Hyperlipidemia, unspecified: Secondary | ICD-10-CM

## 2020-04-28 DIAGNOSIS — I10 Essential (primary) hypertension: Secondary | ICD-10-CM | POA: Diagnosis not present

## 2020-04-28 NOTE — Telephone Encounter (Signed)
LDL is elevated.    She is scheduled to get a coronary calcium score next week I would like for her to start rosuvastatin 10 mg a day  Check lipids, liver, bmp in 3 months

## 2020-04-28 NOTE — Telephone Encounter (Signed)
[name], other daughter of the patient called.  Santiago Glad was with the patient at the time of the phone call. Santiago Glad is not on the [facility] but the patient did give [place] verbal permission to go over the results with her other daughter.  Santiago Glad wanted to go over the results of the patients recent lab work. The daughter also asks that the lab results go to the patient's PCP Dr. Asencion Noble

## 2020-04-28 NOTE — Telephone Encounter (Signed)
Spoke with [name] per the pts verbal ok re: the pots lab work done 04/20/20... I advised her that her Kidney and liver functions tests were WNL but I am not sure if Dr. Acie Fredrickson would like to make any changes re: her LDL.   Copy sent to Dr. Willey Blade per her request.   [name] says no need to call back if Dr. Acie Fredrickson does not want to add a chol med at this time.   [name], [name] [phone]

## 2020-05-01 NOTE — Telephone Encounter (Signed)
Patient's daughter notified.  She reports patient's brother had a severe allergic reaction to a statin medication.  She does not want patient to take the same medication.  She will check with her brother and call us back to let us know what medication brother is allergic to.  They would like follow up lab work to be done in [place] if medication started.

## 2020-05-04 ENCOUNTER — Other Ambulatory Visit: Payer: Self-pay | Admitting: *Deleted

## 2020-05-04 DIAGNOSIS — R079 Chest pain, unspecified: Secondary | ICD-10-CM

## 2020-05-15 ENCOUNTER — Other Ambulatory Visit: Payer: Self-pay

## 2020-05-15 ENCOUNTER — Ambulatory Visit (INDEPENDENT_AMBULATORY_CARE_PROVIDER_SITE_OTHER)
Admission: RE | Admit: 2020-05-15 | Discharge: 2020-05-15 | Disposition: A | Discharge status: Home / Self Care | Payer: Self-pay | Source: Ambulatory Visit | Attending: Cardiovascular Disease | Admitting: Cardiovascular Disease

## 2020-05-15 DIAGNOSIS — R079 Chest pain, unspecified: Secondary | ICD-10-CM

## 2020-05-16 ENCOUNTER — Other Ambulatory Visit: Payer: Medicare Other

## 2020-05-17 ENCOUNTER — Telehealth: Payer: Self-pay | Admitting: Cardiovascular Disease

## 2020-05-17 NOTE — Telephone Encounter (Signed)
I reviewed results with patient's daughter.  She is asking if patient needs additional testing based on results.  She reports patient continues to have the same chest tightness.  It has not increased in frequency since visit with Dr Acie Fredrickson.  Occurs off and on at random times.   Daughter will check with patient's brother to see what medication he had a reaction to and call us back

## 2020-05-17 NOTE — Telephone Encounter (Signed)
I spoke with patient and reviewed results with her.  She requests I contact Santiago Glad to review results and discuss starting medication. I placed call to Santiago Glad and left message to call office

## 2020-05-17 NOTE — Telephone Encounter (Signed)
See previous phone note.  

## 2020-05-17 NOTE — Telephone Encounter (Signed)
Santiago Glad reports it is her brother (patient's son) who had reaction to cholesterol medication. She reports he had severe reaction to Crestor, Lipitor and Zocor.  He had a rare case of autoimmune condition and is disabled from the medication. Daughter is concerned about starting a statin and is asking if there is another medication patient could take

## 2020-05-17 NOTE — Telephone Encounter (Signed)
Pt's daughter is calling back to give Mardene Celeste the information she requested during their previous conversation today. Please advise.

## 2020-05-17 NOTE — Telephone Encounter (Signed)
-----   Message from Thayer Headings, MD sent at 05/16/2020  5:39 PM EST ----- Coronary calcium score of 490. This was 76th percentile for age and sex matched control. Her LDL is 157.  Please start rosuvastatin 10 mg a day .  Her goal LDL is < 70 Ckeck lipids, liver enz, bmp in 3 months

## 2020-05-18 NOTE — Telephone Encounter (Signed)
Please refer her to the lipid clinic for consultation about the next best step

## 2020-05-22 NOTE — Telephone Encounter (Signed)
Patient's daughter notified.  Appointment scheduled for 05/25/20 at 4:00 in lipid clinic. Daughter is very concerned about calcium score and would like to know if patient needs further testing to check for blockages.

## 2020-05-22 NOTE — Telephone Encounter (Signed)
Patient's daughter notified.

## 2020-05-22 NOTE — Telephone Encounter (Signed)
We do not need additional testing at this time.  We will continue to evaluate her at her next office visit

## 2020-05-24 NOTE — Patient Instructions (Addendum)
It was great meeting you today!  We would like your LDL (bad cholesterol) to be less than 70  We will start a medication called Repatha, which you will inject once every 2 weeks  Remember to alternate sites where you inject  Continue your healthy eating and exercise as tolerated  Please call with any questions!  Karren Cobble, PharmD, BCACP, [place], [place] [address], [place], [place] [address] Phone: [phone]; Fax: [phone] 05/25/2020 4:53 PM

## 2020-05-25 ENCOUNTER — Other Ambulatory Visit: Payer: Self-pay

## 2020-05-25 ENCOUNTER — Encounter: Payer: Self-pay | Admitting: Pharmacist

## 2020-05-25 ENCOUNTER — Ambulatory Visit (INDEPENDENT_AMBULATORY_CARE_PROVIDER_SITE_OTHER): Payer: Medicare Other | Admitting: Pharmacist

## 2020-05-25 DIAGNOSIS — E785 Hyperlipidemia, unspecified: Secondary | ICD-10-CM

## 2020-05-25 MED ORDER — EVOLOCUMAB 140 MG/ML ~~LOC~~ SOAJ: 1.0000 mL | SUBCUTANEOUS | 1 refills | Status: DC

## 2020-05-25 NOTE — Progress Notes (Signed)
Patient ID: [name]                 DOB: 30-Nov-1934                    MRN: [id number]     HPI: [name] is a 86 y.o. female patient referred to lipid clinic by Dr. Acie Fredrickson. PMH is significant for HTN and chest pain.  Patient recently had CT Coronary calcium score which showed results of 490 and 76 percentile for her age. However, patient does not want to be started on a statin because her son in law had side effects.  Patient and daughter present today in good spirits.  Live in [place], patient takes care of her husband who had a stroke.  Motivated to lower LDL.    Eats a heart healthy diet and especially prefers vegetables.  Denies alcohol and has never smoked.  Does report she is more sedentary in Winter than summer.    Current Medications: n/a Intolerances: n/a Risk Factors: HTN LDL goal: <70  Diet: Eats most fresh foods, vegetables  Exercise: Active outside during the summer  Family History: Mother had heart disease  Social History: No alcohol and no tobacco  Labs: LDL 157, VLDL 14, Trigs 86, HDL 82, TC 253 (04/20/20 - not on any lipid lowering meds)  Tests: Coronary calcium score of 490. This was 76th percentile for age and sex matched control.  Past Medical History:  Diagnosis Date  . Basal cell carcinoma   . Chest pain   . Diverticulosis   . DJD (degenerative joint disease) of cervical spine   . GERD (gastroesophageal reflux disease)   . Head injury, closed   . Headache(784.0)   . Osteopenia   . Pneumothorax 2006  . PSVT (paroxysmal supraventricular tachycardia) ([place]) 2003    Current Outpatient Medications on File Prior to Visit  Medication Sig Dispense Refill  . amLODipine (NORVASC) 5 MG tablet Take 1 tablet by mouth daily.    Marland Kitchen gabapentin (NEURONTIN) 100 MG capsule Take 100 mg by mouth at bedtime.    Marland Kitchen lisinopril (PRINIVIL,ZESTRIL) 20 MG tablet Take 20 mg by mouth daily.    . naproxen sodium (ANAPROX) 220 MG tablet Take 220 mg by  mouth daily as needed. For pain     No current facility-administered medications on file prior to visit.    Allergies  Allergen Reactions  . Glucose     unknown  . Morphine Nausea And Vomiting and Other (See Comments)    HA  . Sulfonamide Derivatives   . Vancomycin   . Wheat Bran Swelling and Other (See Comments)    Swelling (eyes), sore spots in head  . Penicillins Rash    Has patient had a PCN reaction causing immediate rash, facial/tongue/throat swelling, SOB or lightheadedness with hypotension: No Has patient had a PCN reaction causing severe rash involving mucus membranes or skin necrosis: No Has patient had a PCN reaction that required hospitalization: No Has patient had a PCN reaction occurring within the last 10 years: No If all of the above answers are "NO", then may proceed with Cephalosporin use.     Assessment/Plan:  1. Hyperlipidemia - Patient LDL 157 which is above goal of <70, however patient not on any lipid lowering therapy. Using patient education tools, showed patient how lipids and fat can grow plaques which can eventually rupture to block arteries.  Discussed various non-statin options with patient including Nexlizet and PCSK9i.  Discussed  pros and cons of both and patient and daughter are interested in PCSK9i due to greater LDL lowering effect.  Using demo pen, educated patient on how to administer PCSK9 and educated on storage, site selection, and administration.  Will choose Repatha and complete PA and printed patient education handouts.  Repeat lipid panel set for mid February.  Patient interested in possibly switching to Nexlizet if LDL reaches goal after then.  Karren Cobble, PharmD, BCACP, [place], [place] [address], [place], [place] [address] Phone: [phone]; Fax: [phone] 05/25/2020 5:18 PM

## 2020-06-02 ENCOUNTER — Telehealth: Payer: Self-pay | Admitting: Pharmacist

## 2020-06-02 NOTE — Telephone Encounter (Signed)
Pt's daughter [name] [phone]) called clinic and left a message to follow up about Repatha injections. States injections have not arrived and they do not know if the medication was ever approved.  Rx was sent to mail order pharmacy on 12/2, note from that day states that prior authorization would be submitted.  Called mail order pharmacy who stated pt is a CMS patient and needs to call in to authorize the fill since it's a new rx. They also stated that a prior authorization is needed. We do not have any Part D information on file. Pharmacy transferred me to the PA line who provided me with the below information:  ID: [id number] BIN: [id number] PCN: [id number] GRP: [id number]  Returned call to patient's daughter. Advised her the PharmD who saw pt last week is off today and Monday so I do not know if an authorization was submitted. Advised her that I did submit another authorization and that she or her mother will need to call [facility] advising them that they approve of the shipment. Reviewed anticipated copay with pt's daughter who is fine with this. She did not accompany pt to visit last week but would like to have pt come to the office for first injection in clinic. Will coordinate this once PA is approved and pt receives medication.

## 2020-06-05 NOTE — Telephone Encounter (Signed)
Repatha was denied. I have sent over more information per request of insurance company for appeals.

## 2020-06-07 NOTE — Telephone Encounter (Signed)
Left message on Sheryl (daugthers) voicemail regarding Repatha

## 2020-06-12 NOTE — Telephone Encounter (Signed)
Spoke with Blair Promise (daughter).  Repatha has been approved.  Recommended patient call [facility] to have medication shipped to patient

## 2020-06-30 ENCOUNTER — Ambulatory Visit: Payer: Medicare Other | Admitting: Cardiology

## 2020-08-16 ENCOUNTER — Telehealth: Payer: Self-pay | Admitting: Pharmacist

## 2020-08-16 NOTE — Telephone Encounter (Signed)
Called and spoke with patient to remind her to have lipid panel updated.  Patient will have drawn at [facility].

## 2020-09-06 IMAGING — CR RIGHT HAND - COMPLETE 3+ VIEW
3 series · 3 of 3 positions shown · non-contrast
Comparison: None.

CLINICAL DATA: Recent fall with hand pain, initial encounter

EXAM:
RIGHT HAND - COMPLETE 3+ VIEW

[pa]
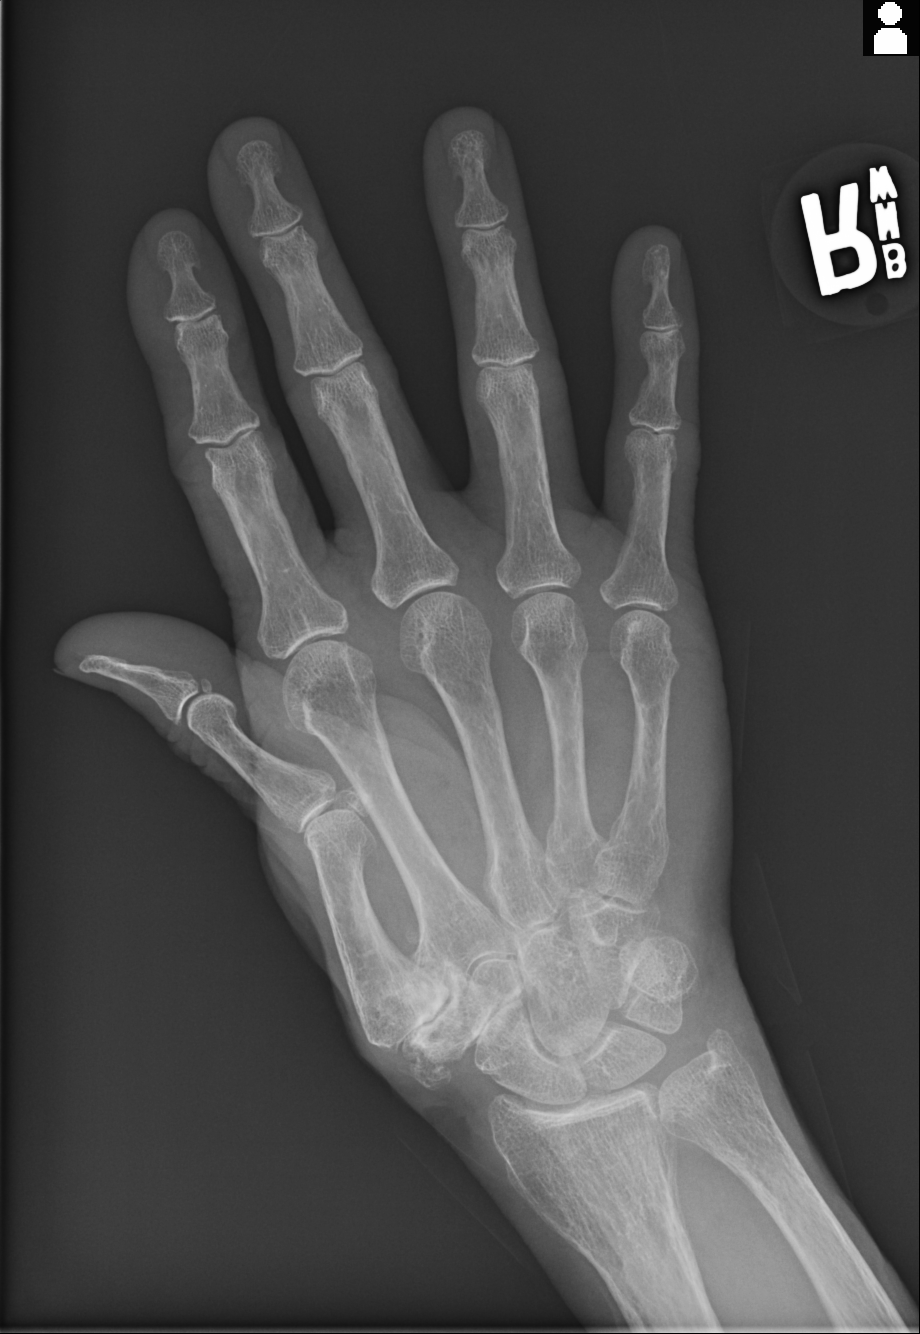

[oblique]
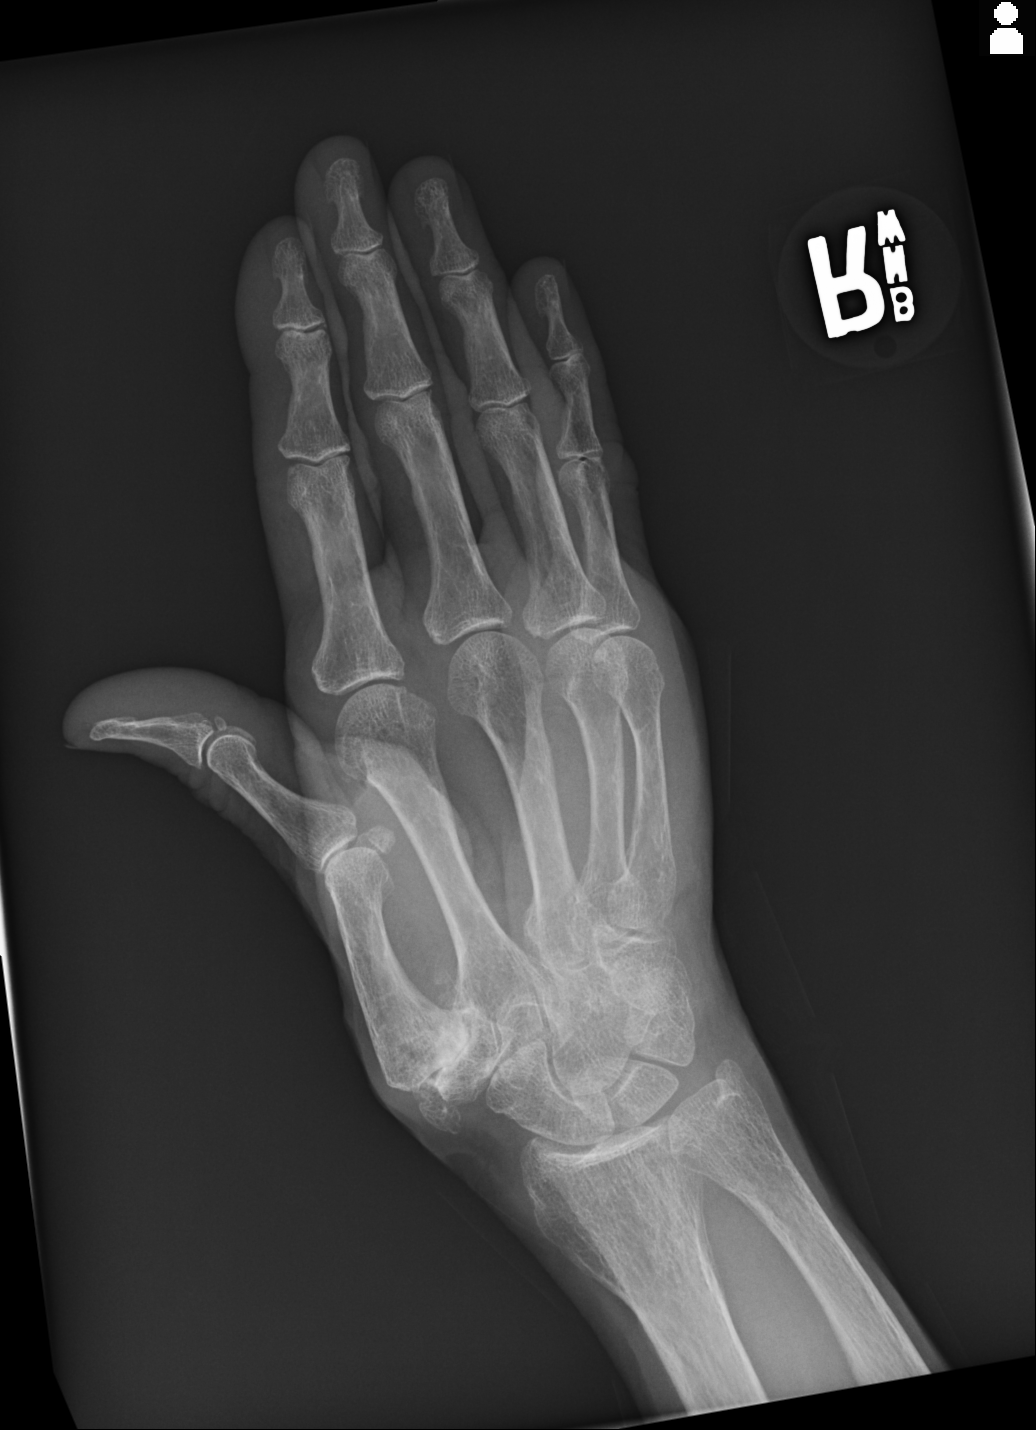

[lat]
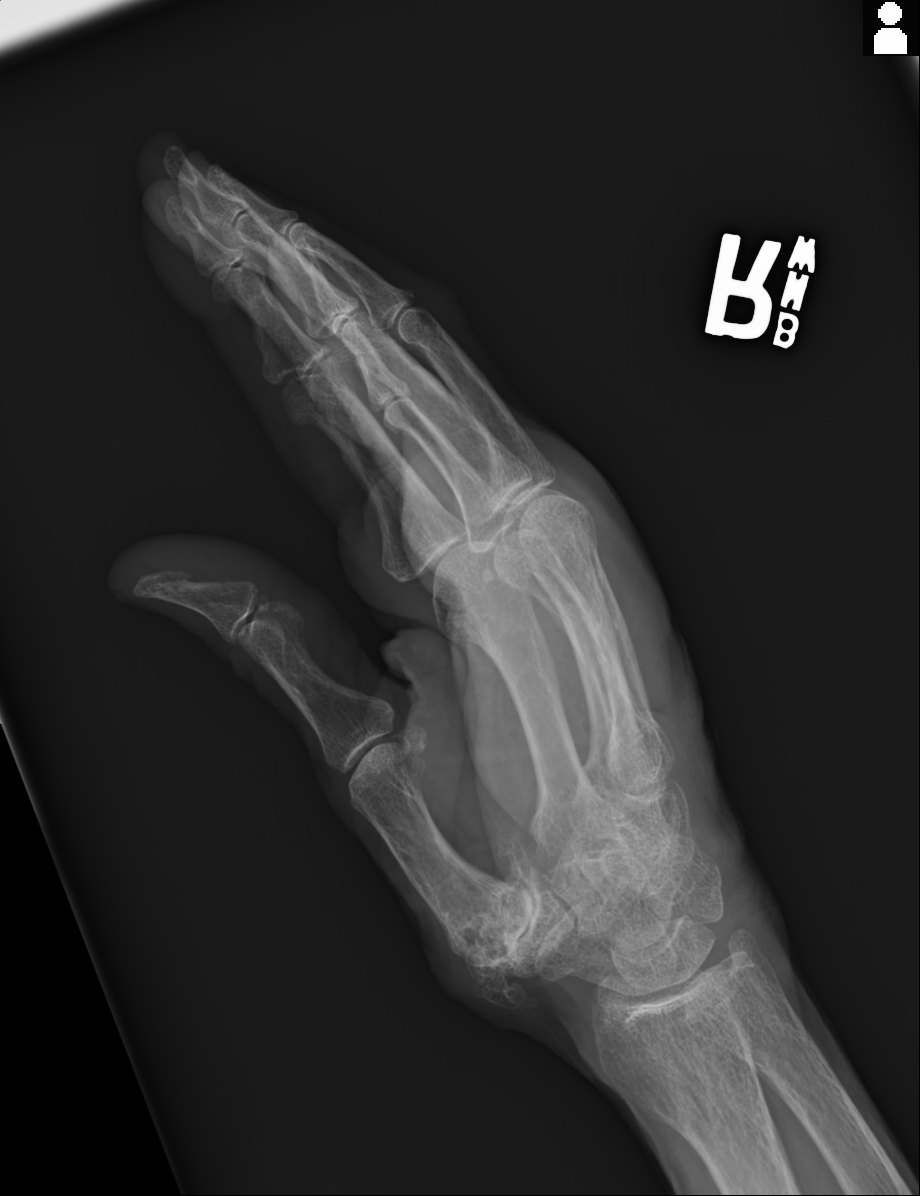

[3 of 3 positions shown; findings below may reference images not displayed]

FINDINGS: Degenerative changes of the first CMC joint are noted. No acute
fracture or dislocation is noted. No soft tissue abnormality is
seen.
IMPRESSION: Degenerative change without acute abnormality.

## 2020-09-28 DIAGNOSIS — I10 Essential (primary) hypertension: Secondary | ICD-10-CM | POA: Diagnosis not present

## 2020-09-28 DIAGNOSIS — R413 Other amnesia: Secondary | ICD-10-CM | POA: Diagnosis not present

## 2020-10-25 DIAGNOSIS — I10 Essential (primary) hypertension: Secondary | ICD-10-CM | POA: Diagnosis not present

## 2020-10-25 DIAGNOSIS — T169XXA Foreign body in ear, unspecified ear, initial encounter: Secondary | ICD-10-CM | POA: Diagnosis not present

## 2020-10-27 ENCOUNTER — Other Ambulatory Visit: Payer: Self-pay | Admitting: Cardiovascular Disease

## 2020-10-27 DIAGNOSIS — E785 Hyperlipidemia, unspecified: Secondary | ICD-10-CM

## 2020-11-21 DIAGNOSIS — E785 Hyperlipidemia, unspecified: Secondary | ICD-10-CM | POA: Diagnosis not present

## 2020-11-21 DIAGNOSIS — Z79899 Other long term (current) drug therapy: Secondary | ICD-10-CM | POA: Diagnosis not present

## 2020-11-21 DIAGNOSIS — I10 Essential (primary) hypertension: Secondary | ICD-10-CM | POA: Diagnosis not present

## 2020-11-21 DIAGNOSIS — R413 Other amnesia: Secondary | ICD-10-CM | POA: Diagnosis not present

## 2020-11-28 ENCOUNTER — Other Ambulatory Visit (HOSPITAL_COMMUNITY): Payer: Self-pay | Admitting: Internal Medicine

## 2020-11-28 DIAGNOSIS — I10 Essential (primary) hypertension: Secondary | ICD-10-CM | POA: Diagnosis not present

## 2020-11-28 DIAGNOSIS — Z780 Asymptomatic menopausal state: Secondary | ICD-10-CM

## 2020-11-28 DIAGNOSIS — M810 Age-related osteoporosis without current pathological fracture: Secondary | ICD-10-CM | POA: Diagnosis not present

## 2020-11-28 DIAGNOSIS — E785 Hyperlipidemia, unspecified: Secondary | ICD-10-CM | POA: Diagnosis not present

## 2020-12-07 ENCOUNTER — Telehealth: Payer: Self-pay | Admitting: Pharmacist

## 2020-12-07 DIAGNOSIS — E785 Hyperlipidemia, unspecified: Secondary | ICD-10-CM

## 2020-12-07 NOTE — Telephone Encounter (Signed)
Patient daughter called, requesting Repatha refill.  Refill was sent last month. Reminded daughter to bring patient in to [facility] to update lipid panel

## 2020-12-08 ENCOUNTER — Other Ambulatory Visit: Payer: Self-pay | Admitting: Pharmacist

## 2020-12-08 ENCOUNTER — Other Ambulatory Visit (HOSPITAL_COMMUNITY)
Admission: RE | Admit: 2020-12-08 | Discharge: 2020-12-08 | Disposition: A | Discharge status: Home / Self Care | Payer: Medicare Other | Source: Ambulatory Visit | Attending: Cardiovascular Disease | Admitting: Cardiovascular Disease

## 2020-12-08 ENCOUNTER — Other Ambulatory Visit: Payer: Self-pay

## 2020-12-08 DIAGNOSIS — E785 Hyperlipidemia, unspecified: Secondary | ICD-10-CM

## 2020-12-08 LAB — LIPID PANEL
Cholesterol: 186 mg/dL (ref 0–200)
HDL: 90 mg/dL (ref >40)
LDL Cholesterol: 80 mg/dL (ref 0–99)
Total CHOL/HDL Ratio: 2.1 RATIO
Triglycerides: 78 mg/dL (ref <150)
VLDL: 16 mg/dL (ref 0–40)

## 2020-12-08 NOTE — Telephone Encounter (Signed)
Pt's daughter called clinic, states she is at [facility] but the lab cannot see any active orders. Looks like lipid panel was not released. This has been done, confirmed lab can see order now.

## 2020-12-08 NOTE — Addendum Note (Signed)
Addended by: Fynlee Rowlands E on: 12/08/2020 09:42 AM   Modules accepted: Orders

## 2021-03-20 ENCOUNTER — Telehealth: Payer: Self-pay

## 2021-03-20 NOTE — Telephone Encounter (Signed)
-----   Message from Leeroy Bock, [place] sent at 03/20/2021  4:47 PM EDT ----- Pt's daughter Blair Promise called and left a message asking about Repatha rx, are you able to give her a call back please? [phone]

## 2021-03-20 NOTE — Telephone Encounter (Signed)
Lmom pt to call me back to discuss issue

## 2021-03-21 NOTE — Telephone Encounter (Signed)
Will remove from med list

## 2021-03-21 NOTE — Addendum Note (Signed)
Addended by: Rollen Sox on: 03/21/2021 04:37 PM   Modules accepted: Orders

## 2021-03-21 NOTE — Telephone Encounter (Signed)
The daughter called stating that she cannot afford repatha I offered pt assistance but they declined and wanted to be taken off of it. Will route to pharmd chris pavero

## 2021-04-02 DIAGNOSIS — Z23 Encounter for immunization: Secondary | ICD-10-CM | POA: Diagnosis not present

## 2021-07-22 DIAGNOSIS — I10 Essential (primary) hypertension: Secondary | ICD-10-CM | POA: Diagnosis not present

## 2021-08-07 DIAGNOSIS — L600 Ingrowing nail: Secondary | ICD-10-CM | POA: Diagnosis not present

## 2021-08-14 ENCOUNTER — Encounter (INDEPENDENT_AMBULATORY_CARE_PROVIDER_SITE_OTHER): Payer: Medicare Other | Admitting: Podiatry

## 2021-08-14 NOTE — Progress Notes (Signed)
NO SHOW. This encounter was created in error - please disregard.

## 2021-10-22 DIAGNOSIS — Z20822 Contact with and (suspected) exposure to covid-19: Secondary | ICD-10-CM | POA: Diagnosis not present

## 2022-02-04 ENCOUNTER — Encounter: Payer: Self-pay | Admitting: Cardiovascular Disease

## 2022-02-04 NOTE — Progress Notes (Unsigned)
Cardiology Office Note:    Date:  02/05/2022   ID:  [name], DOB 04-Feb-1935, MRN [id number]  PCP:  Asencion Noble, MD  [facility] Cardiologist:  Wyvonne Lenz  [facility] Electrophysiologist:  None   Referring MD: Asencion Noble, MD   Chief Complaint  Patient presents with   Hypertension        Hyperlipidemia    Oct. 27, 2021   [name] is a 87 y.o. female with a hx of HTN, and PSVT .  We were asked to see her by Dr. Willey Blade for further evaluation and management of her HTN. Seen with daughter, Santiago Glad   She has episodes of variable BP .  She is on 2 BP meds.   Eats salt regularly   Some episodes of chest tightness, squeezing  Usually occurs when she is stressed . Lasts for 30 minutes or so . Typically she lies down to rest and the tightness improves.  Not related to exercise   Has some forgetfulness  No signs or symptoms of stroke   Aug. 15, 2023 [name] is Seen with son in [place],  Inocencio Homes  is seen for follow up of her HTN, PSVT  Had 1 severe episode of SVT .   Lasted 10 minutes or so  Woke up with this fast HR   Family takes breakfast over every day,  she relates episodes to the family   Has frequent episodes of dizziness, especially in the morming  Symptoms are c/w orthostasis   Has a CAC of 490 .    Past Medical History:  Diagnosis Date   Basal cell carcinoma    Chest pain    Diverticulosis    DJD (degenerative joint disease) of cervical spine    GERD (gastroesophageal reflux disease)    Head injury, closed    Headache(784.0)    Osteopenia    Pneumothorax 2006   PSVT (paroxysmal supraventricular tachycardia) ([place]) 2003    Past Surgical History:  Procedure Laterality Date   ABDOMINAL HYSTERECTOMY     APPENDECTOMY     COLONOSCOPY     NISSEN FUNDOPLICATION     TONSILLECTOMY      Current Medications: Current Meds  Medication Sig   aspirin EC 81 MG tablet Take 81 mg by mouth daily. Swallow whole.   Cholecalciferol  (VITAMIN D3) 125 MCG (5000 UT) CAPS Take by mouth.   lisinopril (PRINIVIL,ZESTRIL) 20 MG tablet Take 20 mg by mouth daily.   memantine (NAMENDA) 5 MG tablet Take 5 mg by mouth daily.   metoprolol succinate (TOPROL XL) 25 MG 24 hr tablet Take 1 tablet (25 mg total) by mouth daily.   naproxen sodium (ANAPROX) 220 MG tablet Take 220 mg by mouth daily as needed. For pain   rosuvastatin (CRESTOR) 5 MG tablet Take 1 tablet (5 mg total) by mouth daily.   [DISCONTINUED] amLODipine (NORVASC) 5 MG tablet Take 1 tablet by mouth daily.     Allergies:   Glucose, Morphine, Sulfonamide derivatives, Vancomycin, Wheat bran, and Penicillins   Social History   Socioeconomic History   Marital status: Married    Spouse name: Myles   Number of children: 4   Years of education: College   Highest education level: Not on file  Occupational History   Occupation: [employer]    Employer: RETIRED  Tobacco Use   Smoking status: Never   Smokeless tobacco: Never  Substance and Sexual Activity   Alcohol use: No   Drug use: No  Sexual activity: Not on file  Other Topics Concern   Not on file  Social History Narrative   No regular exercise   Patient lives at home with spouse.   Caffeine Use:   Social Determinants of Health   Financial Resource Strain: Not on file  Food Insecurity: Not on file  Transportation Needs: Not on file  Physical Activity: Not on file  Stress: Not on file  Social Connections: Not on file     Family History: The patient's family history includes Arthritis in her mother and sister; Asthma in her sister; Heart disease in her father and sister; Kidney failure in her brother; Parkinsonism in her father; Vasculitis in her mother.  ROS:   Please see the history of present illness.     All other systems reviewed and are negative.  EKGs/Labs/Other Studies Reviewed:    The following studies were reviewed today:     Recent Labs: No results found for requested labs within last 365  days.  Recent Lipid Panel    Component Value Date/Time   CHOL 186 12/08/2020 0948   CHOL 253 (H) 04/20/2020 1049   TRIG 78 12/08/2020 0948   HDL 90 12/08/2020 0948   HDL 82 04/20/2020 1049   CHOLHDL 2.1 12/08/2020 0948   VLDL 16 12/08/2020 0948   LDLCALC 80 12/08/2020 0948   LDLCALC 157 (H) 04/20/2020 1049     Risk Assessment/Calculations:       Physical Exam:    Physical Exam: Blood pressure 126/66, pulse 73, height '4\' 9"'$  (1.448 m), weight 112 lb 12.8 oz (51.2 kg), SpO2 97 %.  GEN:  Well nourished, well developed in no acute distress HEENT: Normal NECK: No JVD; No carotid bruits LYMPHATICS: No lymphadenopathy CARDIAC: RRR , no murmurs, rubs, gallops RESPIRATORY:  Clear to auscultation without rales, wheezing or rhonchi  ABDOMEN: Soft, non-tender, non-distended MUSCULOSKELETAL:  No edema; No deformity  SKIN: Warm and dry NEUROLOGIC:  Alert and oriented x 3   EKG:   February 05, 2022; normal sinus rhythm at 73.  Normal EKG.   ASSESSMENT:    1. Hyperlipidemia, unspecified hyperlipidemia type   2. Hypertension, unspecified type   3. PSVT (paroxysmal supraventricular tachycardia) (HCC)     PLAN:      HTN:      2.  Hyperlipidemia:    3.  Orthostatic hypotension:  .  He is continue to have some episodes of orthostatic hypotension especially in the mornings.  We will discontinue amlodipine and continue to follow.  4.  Possible palpitations: She woke up with a loud noise in the house.  She realized that her heart was racing.  It went on for about 10 minutes.  We will start her on Toprol XL 25 mg a day.  We will stop her amlodipine.  We will place a 14-day Hardin Negus event monitor.   Medication Adjustments/Labs and Tests Ordered: Current medicines are reviewed at length with the patient today.  Concerns regarding medicines are outlined above.  Orders Placed This Encounter  Procedures   ALT   Basic metabolic panel   Lipid panel   LONG TERM MONITOR (3-14 DAYS)    EKG 12-Lead   Meds ordered this encounter  Medications   rosuvastatin (CRESTOR) 5 MG tablet    Sig: Take 1 tablet (5 mg total) by mouth daily.    Dispense:  90 tablet    Refill:  3   metoprolol succinate (TOPROL XL) 25 MG 24 hr tablet    Sig:  Take 1 tablet (25 mg total) by mouth daily.    Dispense:  90 tablet    Refill:  3      Patient Instructions  Medication Instructions:  START Rosuvastatin '5mg'$  daily START Metoprolol Succinate (Toprol xl) '25mg'$  daily STOP Amlodipine *If you need a refill on your cardiac medications before your next appointment, please call your pharmacy*   Lab Work: Lipids, ALT, BMET in 3 months If you have labs (blood work) drawn today and your tests are completely normal, you will receive your results only by: [place] (if you have MyChart) OR A paper copy in the mail If you have any lab test that is abnormal or we need to change your treatment, we will call you to review the results.   Testing/Procedures: 14 day monitor Your physician has recommended that you wear an event monitor. Event monitors are medical devices that record the heart's electrical activity. Doctors most often [place] these monitors to diagnose arrhythmias. Arrhythmias are problems with the speed or rhythm of the heartbeat. The monitor is a small, portable device. You can wear one while you do your normal daily activities. This is usually used to diagnose what is causing palpitations/syncope (passing out).  Follow-Up: At [facility], you and your health needs are our priority.  As part of our continuing mission to provide you with exceptional heart care, we have created designated Provider Care Teams.  These Care Teams include your primary Cardiologist (physician) and Advanced Practice Providers (APPs -  Physician Assistants and Nurse Practitioners) who all work together to provide you with the care you need, when you need it.  We recommend signing up for the patient portal called  "MyChart".  Sign up information is provided on this After Visit Summary.  MyChart is used to connect with patients for Virtual Visits (Telemedicine).  Patients are able to view lab/test results, encounter notes, upcoming appointments, etc.  Non-urgent messages can be sent to your provider as well.   To learn more about what you can do with MyChart, go to [email or web address].    Your next appointment:   6 month(s)  The format for your next appointment:   In Person  Provider:   Mertie Moores, MD   Other Instructions ZIO XT- Long Term Monitor Instructions  Your physician has requested you wear a ZIO patch monitor for 14 days.  This is a single patch monitor. [employer] supplies one patch monitor per enrollment. Additional stickers are not available. Please do not apply patch if you will be having a Nuclear Stress Test,  Echocardiogram, Cardiac CT, MRI, or Chest Xray during the period you would be wearing the  monitor. The patch cannot be worn during these tests. You cannot remove and re-apply the  ZIO XT patch monitor.  Your ZIO patch monitor will be mailed 3 day USPS to your address on file. It may take 3-5 days  to receive your monitor after you have been enrolled.  Once you have received your monitor, please review the enclosed instructions. Your monitor  has already been registered assigning a specific monitor serial # to you.  Billing and Patient Assistance Program Information  We have supplied [employer] with any of your insurance information on file for billing purposes. [employer] offers a sliding scale Patient Assistance Program for patients that do not have  insurance, or whose insurance does not completely cover the cost of the ZIO monitor.  You must apply for the Patient Assistance Program to qualify for this discounted rate.  To apply, please call [employer] at [phone], select option 4, select option 2, ask to apply for  Patient Assistance Program. Theodore Demark will ask your  household income, and how many people  are in your household. They will quote your out-of-pocket cost based on that information.  [employer] will also be able to set up a 76-month interest-free payment plan if needed.  Applying the monitor   Shave hair from upper left chest.  Hold abrader disc by orange tab. Rub abrader in 40 strokes over the upper left chest as  indicated in your monitor instructions.  Clean area with 4 enclosed alcohol pads. Let dry.  Apply patch as indicated in monitor instructions. Patch will be placed under collarbone on left  side of chest with arrow pointing upward.  Rub patch adhesive wings for 2 minutes. Remove white label marked "1". Remove the white  label marked "2". Rub patch adhesive wings for 2 additional minutes.  While looking in a mirror, press and release button in center of patch. A small green light will  flash 3-4 times. This will be your only indicator that the monitor has been turned on.  Do not shower for the first 24 hours. You may shower after the first 24 hours.  Press the button if you feel a symptom. You will hear a small click. Record Date, Time and  Symptom in the Patient Logbook.  When you are ready to remove the patch, follow instructions on the last 2 pages of Patient  Logbook. Stick patch monitor onto the last page of Patient Logbook.  Place Patient Logbook in the blue and white box. Use locking tab on box and tape box closed  securely. The blue and white box has prepaid postage on it. Please place it in the mailbox as  soon as possible. Your physician should have your test results approximately 7 days after the  monitor has been [facility]  Call ILyonsat [phone] you have questions regarding  your ZIO XT patch monitor. Call them immediately if you see an orange light blinking on your  monitor.  If your monitor falls off in less than 4 days, contact our Monitor department at [phone]   If your monitor becomes loose or falls off after 4 days call [employer] at [phone]  suggestions on securing your monitor   Important Information About Sugar         Signed, PMertie Moores MD  02/05/2022 6:04 PM    CRafael Gonzalez

## 2022-02-05 ENCOUNTER — Ambulatory Visit (INDEPENDENT_AMBULATORY_CARE_PROVIDER_SITE_OTHER): Payer: Medicare Other

## 2022-02-05 ENCOUNTER — Encounter: Payer: Self-pay | Admitting: Cardiovascular Disease

## 2022-02-05 ENCOUNTER — Ambulatory Visit: Payer: Medicare Other | Admitting: Cardiovascular Disease

## 2022-02-05 VITALS — BP 126/66 | HR 73 | Ht <= 58 in | Wt 112.8 lb

## 2022-02-05 DIAGNOSIS — I10 Essential (primary) hypertension: Secondary | ICD-10-CM | POA: Diagnosis not present

## 2022-02-05 DIAGNOSIS — I471 Supraventricular tachycardia, unspecified: Secondary | ICD-10-CM

## 2022-02-05 DIAGNOSIS — E785 Hyperlipidemia, unspecified: Secondary | ICD-10-CM | POA: Diagnosis not present

## 2022-02-05 MED ORDER — ROSUVASTATIN CALCIUM 5 MG PO TABS: 5.0000 mg | ORAL_TABLET | Freq: Every day | ORAL | 3 refills | Status: DC

## 2022-02-05 MED ORDER — METOPROLOL SUCCINATE ER 25 MG PO TB24: 25.0000 mg | ORAL_TABLET | Freq: Every day | ORAL | 3 refills | Status: DC

## 2022-02-05 NOTE — Patient Instructions (Signed)
Medication Instructions:  START Rosuvastatin '5mg'$  daily START Metoprolol Succinate (Toprol xl) '25mg'$  daily STOP Amlodipine *If you need a refill on your cardiac medications before your next appointment, please call your pharmacy*   Lab Work: Lipids, ALT, BMET in 3 months If you have labs (blood work) drawn today and your tests are completely normal, you will receive your results only by: [place] (if you have MyChart) OR A paper copy in the mail If you have any lab test that is abnormal or we need to change your treatment, we will call you to review the results.   Testing/Procedures: 14 day monitor Your physician has recommended that you wear an event monitor. Event monitors are medical devices that record the heart's electrical activity. Doctors most often [place] these monitors to diagnose arrhythmias. Arrhythmias are problems with the speed or rhythm of the heartbeat. The monitor is a small, portable device. You can wear one while you do your normal daily activities. This is usually used to diagnose what is causing palpitations/syncope (passing out).  Follow-Up: At [facility], you and your health needs are our priority.  As part of our continuing mission to provide you with exceptional heart care, we have created designated Provider Care Teams.  These Care Teams include your primary Cardiologist (physician) and Advanced Practice Providers (APPs -  Physician Assistants and Nurse Practitioners) who all work together to provide you with the care you need, when you need it.  We recommend signing up for the patient portal called "MyChart".  Sign up information is provided on this After Visit Summary.  MyChart is used to connect with patients for Virtual Visits (Telemedicine).  Patients are able to view lab/test results, encounter notes, upcoming appointments, etc.  Non-urgent messages can be sent to your provider as well.   To learn more about what you can do with MyChart, go to  [email or web address].    Your next appointment:   6 month(s)  The format for your next appointment:   In Person  Provider:   Mertie Moores, MD   Other Instructions ZIO XT- Long Term Monitor Instructions  Your physician has requested you wear a ZIO patch monitor for 14 days.  This is a single patch monitor. [employer] supplies one patch monitor per enrollment. Additional stickers are not available. Please do not apply patch if you will be having a Nuclear Stress Test,  Echocardiogram, Cardiac CT, MRI, or Chest Xray during the period you would be wearing the  monitor. The patch cannot be worn during these tests. You cannot remove and re-apply the  ZIO XT patch monitor.  Your ZIO patch monitor will be mailed 3 day USPS to your address on file. It may take 3-5 days  to receive your monitor after you have been enrolled.  Once you have received your monitor, please review the enclosed instructions. Your monitor  has already been registered assigning a specific monitor serial # to you.  Billing and Patient Assistance Program Information  We have supplied [employer] with any of your insurance information on file for billing purposes. [employer] offers a sliding scale Patient Assistance Program for patients that do not have  insurance, or whose insurance does not completely cover the cost of the ZIO monitor.  You must apply for the Patient Assistance Program to qualify for this discounted rate.  To apply, please call [employer] at [phone], select option 4, select option 2, ask to apply for  Patient Assistance Program. Theodore Demark will ask your household income, and how  many people  are in your household. They will quote your out-of-pocket cost based on that information.  [employer] will also be able to set up a 10-month interest-free payment plan if needed.  Applying the monitor   Shave hair from upper left chest.  Hold abrader disc by orange tab. Rub abrader in 40 strokes over the upper left  chest as  indicated in your monitor instructions.  Clean area with 4 enclosed alcohol pads. Let dry.  Apply patch as indicated in monitor instructions. Patch will be placed under collarbone on left  side of chest with arrow pointing upward.  Rub patch adhesive wings for 2 minutes. Remove white label marked "1". Remove the white  label marked "2". Rub patch adhesive wings for 2 additional minutes.  While looking in a mirror, press and release button in center of patch. A small green light will  flash 3-4 times. This will be your only indicator that the monitor has been turned on.  Do not shower for the first 24 hours. You may shower after the first 24 hours.  Press the button if you feel a symptom. You will hear a small click. Record Date, Time and  Symptom in the Patient Logbook.  When you are ready to remove the patch, follow instructions on the last 2 pages of Patient  Logbook. Stick patch monitor onto the last page of Patient Logbook.  Place Patient Logbook in the blue and white box. Use locking tab on box and tape box closed  securely. The blue and white box has prepaid postage on it. Please place it in the mailbox as  soon as possible. Your physician should have your test results approximately 7 days after the  monitor has been [facility]  Call IVictorat [phone] you have questions regarding  your ZIO XT patch monitor. Call them immediately if you see an orange light blinking on your  monitor.  If your monitor falls off in less than 4 days, contact our Monitor department at [phone]  If your monitor becomes loose or falls off after 4 days call [employer] at [phone]  suggestions on securing your monitor   Important Information About Sugar

## 2022-02-05 NOTE — Progress Notes (Unsigned)
Enrolled for [employer] to mail a ZIO XT long term holter monitor to the patients address on file.  

## 2022-02-19 DIAGNOSIS — E785 Hyperlipidemia, unspecified: Secondary | ICD-10-CM

## 2022-02-19 DIAGNOSIS — I471 Supraventricular tachycardia: Secondary | ICD-10-CM

## 2022-02-19 DIAGNOSIS — I10 Essential (primary) hypertension: Secondary | ICD-10-CM

## 2022-03-14 DIAGNOSIS — I471 Supraventricular tachycardia: Secondary | ICD-10-CM | POA: Diagnosis not present

## 2022-03-14 DIAGNOSIS — E785 Hyperlipidemia, unspecified: Secondary | ICD-10-CM | POA: Diagnosis not present

## 2022-03-21 ENCOUNTER — Telehealth: Payer: Self-pay | Admitting: Cardiovascular Disease

## 2022-03-21 NOTE — Telephone Encounter (Signed)
Patient's daughter called and mentioned that since Dr. Acie Fredrickson put patient on two new medications, metoprolol succinate (TOPROL XL) 25 MG 24 hr tablet and rosuvastatin (CRESTOR) 5 MG tablet, patient has no energy, feels bad all the time, extreme tiredness, confusion, and dizziness. Please call back to discuss

## 2022-03-22 NOTE — Telephone Encounter (Signed)
Returned call to daughter Laurell Josephs and patient was riding in car with her at time of call. States that she did discontinue the Amlodipine since visit on 02/05/22. Says that the symptoms of lethargy, confusion, and dizziness have been present since beginning Metoprolol and Crestor. Does state that she experienced dizziness prior, but "nothing like this." Educated that the crestor would not cause these symptoms and they all likely relate to her BP dropping/running low. Daughter states that they do not check her BP at home, so has no readings to provide. Asked that she begin checking her BP and heart rate daily, and to check when feeling bad during the day, and to record this and call back in 1 week to provide readings. Also advised that she cut the Metoprolol in half and take 1/2 tablet daily until she calls [place] with the BP/HR log. Condones understanding. Will await results from patient. She will call us if she needs [place] in the meantime.

## 2022-04-12 ENCOUNTER — Telehealth: Payer: Self-pay | Admitting: Cardiovascular Disease

## 2022-04-12 NOTE — Telephone Encounter (Signed)
Patient's daughter calling with BP readings:  145/75 145/72 154/59 153/91 152/89 154/86 153/89 152/87 152/109 153/76 176/92 158/82 158/85 131/89 183/96 170/92 155/90 140/86 131/83 134/103  Patient's daughter states they used a wrist BP cuff because they could not find her standard one. She says she is not sure if it is a little bit inflated, because it is not a standard one. She says the patient is still feeling tired every day, but not as extreme as before.

## 2022-04-12 NOTE — Telephone Encounter (Signed)
March 22, 2022 Me   03/22/22 11:54 AM Note Returned call to daughter Laurell Josephs and patient was riding in car with her at time of call. States that she did discontinue the Amlodipine since visit on 02/05/22. Says that the symptoms of lethargy, confusion, and dizziness have been present since beginning Metoprolol and Crestor. Does state that she experienced dizziness prior, but "nothing like this." Educated that the crestor would not cause these symptoms and they all likely relate to her BP dropping/running low. Daughter states that they do not check her BP at home, so has no readings to provide. Asked that she begin checking her BP and heart rate daily, and to check when feeling bad during the day, and to record this and call back in 1 week to provide readings. Also advised that she cut the Metoprolol in half and take 1/2 tablet daily until she calls [place] with the BP/HR log. Condones understanding. Will await results from patient. She will call us if she needs [place] in the meantime.     Returned call to [employer] who states that these readings are all on Metoprolol Succinate 12.74m daily. BP log provided shows two readings (AM & PM check),but no dates. Does attest that HR readings range in the 70's.  145/75  145/72 154/59  153/91 152/89  154/86 153/89  152/87 152/109 153/76 176/92  158/82 158/85  131/89 183/96  170/92 155/90  140/86 131/83  134/103 Daughter states these readings are done on a wrist cuff as they couldn't find their usual brachial BP kit. She states she checked it on herself and noticed that it was 5-130mg higher than her pressure usually runs. Advised her that the trend of these readings are still much too high and indicate she does need the entire 2557mose. Daughter is adamant that mother seems improved, only slightly, but was worse with lethargy and dizziness on the whole dose and doesn't want to increase her back. Assured her again that BP readings are too high and the lethargy should not  be linked to crestor. She apologizes for the delay in getting back to us [place] the BP readings and condones that her mom is still battling with pretty extreme lethargy. Explained that I would route note to MD to review, but it may be best to see her again in office or for her to visit PCP. Shryl will wait to hear back from us.[place]

## 2022-04-16 ENCOUNTER — Encounter: Payer: Self-pay | Admitting: Cardiovascular Disease

## 2022-04-16 NOTE — Telephone Encounter (Signed)
Nahser, Wonda Cheng, MD  Donnalee Curry K Caller: Unspecified (4 days ago,  3:08 PM) Please schedule her an appt with me or an app to discus her BP / lethargy   PN   Called daughter Laurell Josephs back who states that they did go buy a new brachial BP kit and have been checking it along with the wrist meter twice daily and still getting BP readings into the high 483-475 systolic range. Asked her to bring her cuff into the office with her at the visit scheduled for 04/17/22 _0   and to pick either meter and use only that one so that we can look for trends in pressure.

## 2022-04-16 NOTE — Progress Notes (Unsigned)
Cardiology Office Note:    Date:  04/17/2022   ID:  [name], DOB [birth year], MRN [id number]  PCP:  Asencion Noble, MD  [facility] Cardiologist:  Wyvonne Lenz  [facility] Electrophysiologist:  None   Referring MD: Asencion Noble, MD   Chief Complaint  Patient presents with   Hypertension   Hyperlipidemia    Oct. 27, 2021   [name] is a 87 y.o. female with a hx of HTN, and PSVT .  We were asked to see her by Dr. Willey Blade for further evaluation and management of her HTN. Seen with daughter, Santiago Glad   She has episodes of variable BP .  She is on 2 BP meds.   Eats salt regularly   Some episodes of chest tightness, squeezing  Usually occurs when she is stressed . Lasts for 30 minutes or so . Typically she lies down to rest and the tightness improves.  Not related to exercise   Has some forgetfulness  No signs or symptoms of stroke   Aug. 15, 2023 [name] is Seen with son in [place],  Inocencio Homes  is seen for follow up of her HTN, PSVT  Had 1 severe episode of SVT .   Lasted 10 minutes or so  Woke up with this fast HR   Family takes breakfast over every day,  she relates episodes to the family   Has frequent episodes of dizziness, especially in the morming  Symptoms are c/w orthostasis   Has a CAC of 490 .   Oct. 25, 2023 [name] is seen for follow up of her HTN, SVT Was having significant SVT when I saw her in Aug  Has palpitations on occasion , She has not mentioned it to her daughter recently  Lots of back pain  As a result, her BP is elevated.   Avoids salty foods for the most part   Will DC Lisinopril 20 mg a day and start valsartan 160 mg a day        Past Medical History:  Diagnosis Date   Basal cell carcinoma    Chest pain    Diverticulosis    DJD (degenerative joint disease) of cervical spine    GERD (gastroesophageal reflux disease)    Head injury, closed    Headache(784.0)    Osteopenia    Pneumothorax 2006   PSVT  (paroxysmal supraventricular tachycardia) 2003    Past Surgical History:  Procedure Laterality Date   ABDOMINAL HYSTERECTOMY     APPENDECTOMY     COLONOSCOPY     NISSEN FUNDOPLICATION     TONSILLECTOMY      Current Medications: Current Meds  Medication Sig   aspirin EC 81 MG tablet Take 81 mg by mouth daily. Swallow whole.   Cholecalciferol (VITAMIN D3) 125 MCG (5000 UT) CAPS Take by mouth.   gabapentin (NEURONTIN) 100 MG capsule Take 100 mg by mouth as needed.   metoprolol succinate (TOPROL XL) 25 MG 24 hr tablet Take 1 tablet (25 mg total) by mouth daily.   naproxen sodium (ANAPROX) 220 MG tablet Take 220 mg by mouth daily as needed. For pain   rosuvastatin (CRESTOR) 5 MG tablet Take 1 tablet (5 mg total) by mouth daily.   valsartan (DIOVAN) 160 MG tablet Take 1 tablet (160 mg total) by mouth daily.   [DISCONTINUED] lisinopril (PRINIVIL,ZESTRIL) 20 MG tablet Take 20 mg by mouth daily.     Allergies:   Glucose, Morphine, Sulfonamide derivatives, Vancomycin, Wheat bran, and  Penicillins   Social History   Socioeconomic History   Marital status: Married    Spouse name: Myles   Number of children: 4   Years of education: College   Highest education level: Not on file  Occupational History   Occupation: August    Employer: RETIRED  Tobacco Use   Smoking status: Never   Smokeless tobacco: Never  Substance and Sexual Activity   Alcohol use: No   Drug use: No   Sexual activity: Not on file  Other Topics Concern   Not on file  Social History Narrative   No regular exercise   Patient lives at home with spouse.   Caffeine Use:   Social Determinants of Health   Financial Resource Strain: Not on file  Food Insecurity: Not on file  Transportation Needs: Not on file  Physical Activity: Not on file  Stress: Not on file  Social Connections: Not on file     Family History: The patient's family history includes Arthritis in her mother and sister; Asthma in her sister;  Heart disease in her father and sister; Kidney failure in her brother; Parkinsonism in her father; Vasculitis in her mother.  ROS:   Please see the history of present illness.     All other systems reviewed and are negative.  EKGs/Labs/Other Studies Reviewed:    The following studies were reviewed today:     Recent Labs: No results found for requested labs within last 365 days.  Recent Lipid Panel    Component Value Date/Time   CHOL 186 12/08/2020 0948   CHOL 253 (H) 04/20/2020 1049   TRIG 78 12/08/2020 0948   HDL 90 12/08/2020 0948   HDL 82 04/20/2020 1049   CHOLHDL 2.1 12/08/2020 0948   VLDL 16 12/08/2020 0948   LDLCALC 80 12/08/2020 0948   LDLCALC 157 (H) 04/20/2020 1049     Risk Assessment/Calculations:       Physical Exam:    Physical Exam: Blood pressure (!) 165/80, pulse 61, height '4\' 9"'$  (1.448 m), weight 113 lb 9.6 oz (51.5 kg), SpO2 96 %.  HYPERTENSION CONTROL Vitals:   04/17/22 1411 04/17/22 1424  BP: (!) 172/88 (!) 165/80    The patient's blood pressure is elevated above target today.  In order to address the patient's elevated BP: A new medication was prescribed today.       GEN:  Well nourished, well developed in no acute distress HEENT: Normal NECK: No JVD; No carotid bruits LYMPHATICS: No lymphadenopathy CARDIAC: RRR , no murmurs, rubs, gallops RESPIRATORY:  Clear to auscultation without rales, wheezing or rhonchi  ABDOMEN: Soft, non-tender, non-distended MUSCULOSKELETAL:  No edema; No deformity  SKIN: Warm and dry NEUROLOGIC:  Alert and oriented x 3    EKG:       ASSESSMENT:    1. Primary hypertension      PLAN:      HTN:   BP is elevated today .  Will DC lisinopril , start Valsartan 160 mg a day .   Asked her to limit her salt .   BMP in 3 weeks  Typically she does not eat much salt   We had her take her blood pressures using both her home blood pressure cuff and the wrist blood pressure monitor.  The arm cuff measured  a little higher than what we obtained in the office and the wrist blood pressure cuff measured lower than what we had in the office.  Both were off by 10-15 points  Daughter will keep a BP log and will brink it with her next office visit     2.  Hyperlipidemia:    3.  Orthostatic hypotension:  .     4.  Possible palpitations:     Medication Adjustments/Labs and Tests Ordered: Current medicines are reviewed at length with the patient today.  Concerns regarding medicines are outlined above.  No orders of the defined types were placed in this encounter.  Meds ordered this encounter  Medications   valsartan (DIOVAN) 160 MG tablet    Sig: Take 1 tablet (160 mg total) by mouth daily.    Dispense:  90 tablet    Refill:  3      Patient Instructions  Medication Instructions:  Your physician has recommended you make the following change in your medication:  1) STOP taking lisinopril  2) START taking valsartan 160 mg daily   *If you need a refill on your cardiac medications before your next appointment, please call your pharmacy*  Lab Work: 11/14 anytime between 7:15am and 5:00pm  Follow-Up: At [facility], you and your health needs are our priority.  As part of our continuing mission to provide you with exceptional heart care, we have created designated Provider Care Teams.  These Care Teams include your primary Cardiologist (physician) and Advanced Practice Providers (APPs -  Physician Assistants and Nurse Practitioners) who all work together to provide you with the care you need, when you need it.  Your next appointment:   3 month(s)  The format for your next appointment:   In Person  Provider:   Christen Bame, NP   Important Information About Sugar         Signed, Mertie Moores, MD  04/17/2022 2:55 PM    [place]

## 2022-04-17 ENCOUNTER — Ambulatory Visit: Payer: Medicare Other | Attending: Cardiovascular Disease | Admitting: Cardiovascular Disease

## 2022-04-17 ENCOUNTER — Encounter: Payer: Self-pay | Admitting: Cardiovascular Disease

## 2022-04-17 ENCOUNTER — Other Ambulatory Visit: Payer: Self-pay

## 2022-04-17 VITALS — BP 165/80 | HR 61 | Ht <= 58 in | Wt 113.6 lb

## 2022-04-17 DIAGNOSIS — I10 Essential (primary) hypertension: Secondary | ICD-10-CM | POA: Diagnosis not present

## 2022-04-17 DIAGNOSIS — I471 Supraventricular tachycardia, unspecified: Secondary | ICD-10-CM

## 2022-04-17 DIAGNOSIS — E785 Hyperlipidemia, unspecified: Secondary | ICD-10-CM

## 2022-04-17 MED ORDER — VALSARTAN 160 MG PO TABS: 160.0000 mg | ORAL_TABLET | Freq: Every day | ORAL | 3 refills | Status: DC

## 2022-04-17 NOTE — Patient Instructions (Addendum)
Medication Instructions:  Your physician has recommended you make the following change in your medication:  1) STOP taking lisinopril  2) START taking valsartan 160 mg daily   *If you need a refill on your cardiac medications before your next appointment, please call your pharmacy*  Lab Work: 11/14 anytime between 7:15am and 5:00pm  Follow-Up: At [facility], you and your health needs are our priority.  As part of our continuing mission to provide you with exceptional heart care, we have created designated Provider Care Teams.  These Care Teams include your primary Cardiologist (physician) and Advanced Practice Providers (APPs -  Physician Assistants and Nurse Practitioners) who all work together to provide you with the care you need, when you need it.  Your next appointment:   3 month(s)  The format for your next appointment:   In Person  Provider:   Christen Bame, NP   Important Information About Sugar

## 2022-04-17 NOTE — Addendum Note (Signed)
Addended by: Bernestine Amass on: 04/17/2022 02:40 PM   Modules accepted: Orders

## 2022-04-26 ENCOUNTER — Telehealth: Payer: Self-pay | Admitting: Cardiovascular Disease

## 2022-04-26 DIAGNOSIS — Z79899 Other long term (current) drug therapy: Secondary | ICD-10-CM

## 2022-04-26 NOTE — Telephone Encounter (Signed)
Pt c/o BP issue: STAT if pt c/o blurred vision, one-sided weakness or slurred speech  1. What are your last 5 BP readings?  Readings taken in the wrist and arm, AM and PM  10/25: 155/90 61 (wrist)            199/85 62 (arm)            141/77 60    10/27: 158/98 56            151/90 63 (wrist)            158/98 66 (arm)  10/28: 144/87 77 (wrist)            186/90 75 (arm)  10/29: 152/95 61             151/89 66              172/82 67 (arm)  10/30: 155/82 68 (wrist)            165/76            158/68 68 (wrist)            161/87 68 (arm)  10/31: 156/86  69            153/83 72  11/01: 153/85 66            176/85 78 (arm)  11/02:149/83 64 (wrist)           147//76 65 (arm)  171/82 65 (arm) 141/120 64 (wrist)  11/03: 158/82 68 (wrist)  184/83 (arm)  2. Are you having any other symptoms (ex. Dizziness, headache, blurred vision, passed out)?  Patient's daughter states the patient has been very tired  3. What is your BP issue?  Patient's daughter states the patient's BP is elevated

## 2022-04-30 MED ORDER — POTASSIUM CHLORIDE ER 10 MEQ PO TBCR: 10.0000 meq | EXTENDED_RELEASE_TABLET | Freq: Every day | ORAL | 3 refills | Status: DC

## 2022-04-30 MED ORDER — HYDROCHLOROTHIAZIDE 25 MG PO TABS: 25.0000 mg | ORAL_TABLET | Freq: Every day | ORAL | 3 refills | Status: DC

## 2022-04-30 NOTE — Telephone Encounter (Signed)
Pt seen 04/17/22 by NA for HTN:  HTN:   BP is elevated today .  Will DC lisinopril , start Valsartan 160 mg a day .   Asked her to limit her salt .   BMP in 3 weeks  Typically she does not eat much salt  We had her take her blood pressures using both her home blood pressure cuff and the wrist blood pressure monitor.  The arm cuff measured a little higher than what we obtained in the office and the wrist blood pressure cuff measured lower than what we had in the office.  Both were off by 10-15 points Daughter will keep a BP log and will brink it with her next office visit    Spoke with Dr Acie Fredrickson in clinic who advised that we start patient on hydrochlorothiazide '25mg'$  daily and KCL 65mq daily and do repeat BMET with OV in 3-4 weeks. Returned call to daughter SSpero Geraldswho agrees to plan and OV/labs have been scheduled for 05/21/22.

## 2022-05-06 DIAGNOSIS — I10 Essential (primary) hypertension: Secondary | ICD-10-CM | POA: Diagnosis not present

## 2022-05-06 DIAGNOSIS — E785 Hyperlipidemia, unspecified: Secondary | ICD-10-CM | POA: Diagnosis not present

## 2022-05-06 DIAGNOSIS — I471 Supraventricular tachycardia, unspecified: Secondary | ICD-10-CM | POA: Diagnosis not present

## 2022-05-06 LAB — BASIC METABOLIC PANEL
BUN/Creatinine Ratio: 13 (ref 12–28)
BUN: 10 mg/dL (ref 8–27)
CO2: 26 mmol/L (ref 20–29)
Calcium: 9.4 mg/dL (ref 8.7–10.3)
Chloride: 101 mmol/L (ref 96–106)
Creatinine, Ser: 0.79 mg/dL (ref 0.57–1.00)
Glucose: 85 mg/dL (ref 70–99)
Potassium: 4 mmol/L (ref 3.5–5.2)
Sodium: 141 mmol/L (ref 134–144)
eGFR: 73 mL/min/{1.73_m2} (ref >59)

## 2022-05-06 LAB — ALT: ALT: 12 IU/L (ref 0–32)

## 2022-05-07 ENCOUNTER — Other Ambulatory Visit: Payer: Medicare Other

## 2022-05-07 LAB — LIPID PANEL
Chol/HDL Ratio: 2.6 ratio (ref 0.0–4.4)
Cholesterol, Total: 173 mg/dL (ref 100–199)
HDL: 67 mg/dL
LDL Chol Calc (NIH): 91 mg/dL (ref 0–99)
Triglycerides: 78 mg/dL (ref 0–149)
VLDL Cholesterol Cal: 15 mg/dL (ref 5–40)

## 2022-05-21 ENCOUNTER — Other Ambulatory Visit: Payer: Medicare Other

## 2022-05-21 ENCOUNTER — Ambulatory Visit: Payer: Medicare Other | Admitting: Nurse Practitioner

## 2022-05-21 NOTE — Progress Notes (Deleted)
Cardiology Office Note:    Date:  05/21/2022   ID:  [name], DOB [birth year], MRN [id number]  PCP:  Asencion Noble, MD   [facility] Providers Cardiologist:  Mertie Moores, MD { Click to update primary MD,subspecialty MD or APP then REFRESH:1}    Referring MD: Asencion Noble, MD   Chief Complaint: ***  History of Present Illness:    [name] is a *** 87 y.o. female with a hx of HTN, PSVT,   Referred to cardiology by PCP for management of hypertension and seen by Dr. Acie Fredrickson October 2021.  She reported some episodes of chest tightness and squeezing, usually occurring when she is stressed and lasting for 30 minutes or so.  Typically she lies down to rest and the tightness improves. Pain did not intensify with exercise.  Her daughter requested that she get a coronary calcium score which was completed 05/16/2020 and revealed coronary calcium score of 490, 76 percentile for age/sex.  She did not wish to be started on a statin due to side effects reported by her son-in-law.  She was referred to lipid clinic for management of hyperlipidemia  Last cardiology clinic visit was 04/17/2022 with Dr. Acie Fredrickson.  Patient reported dizziness in the mornings and was having palpitations thought to be SVT. BP elevated 2/2 back pain, thought to have some orthostasis in the mornings. Lisinopril was changed to valsartan. She called back to report elevated BP on 11/7. HCTZ 25 mg and Kdur 10 mEq was started and she was scheduled for follow-up visit.  Today, she is here   Past Medical History:  Diagnosis Date   Basal cell carcinoma    Chest pain    Diverticulosis    DJD (degenerative joint disease) of cervical spine    GERD (gastroesophageal reflux disease)    Head injury, closed    Headache(784.0)    Osteopenia    Pneumothorax 2006   PSVT (paroxysmal supraventricular tachycardia) 2003    Past Surgical History:  Procedure Laterality Date   ABDOMINAL HYSTERECTOMY     APPENDECTOMY      COLONOSCOPY     NISSEN FUNDOPLICATION     TONSILLECTOMY      Current Medications: No outpatient medications have been marked as taking for the 05/21/22 encounter (Appointment) with Ann Maki Lanice Schwab, NP.     Allergies:   Glucose, Morphine, Sulfonamide derivatives, Vancomycin, Wheat bran, and Penicillins   Social History   Socioeconomic History   Marital status: Married    Spouse name: Myles   Number of children: 4   Years of education: College   Highest education level: Not on file  Occupational History   Occupation: [employer]    Employer: RETIRED  Tobacco Use   Smoking status: Never   Smokeless tobacco: Never  Substance and Sexual Activity   Alcohol use: No   Drug use: No   Sexual activity: Not on file  Other Topics Concern   Not on file  Social History Narrative   No regular exercise   Patient lives at home with spouse.   Caffeine Use:   Social Determinants of Health   Financial Resource Strain: Not on file  Food Insecurity: Not on file  Transportation Needs: Not on file  Physical Activity: Not on file  Stress: Not on file  Social Connections: Not on file     Family History: The patient's ***family history includes Arthritis in her mother and sister; Asthma in her sister; Heart disease in her father and sister;  Kidney failure in her brother; Parkinsonism in her father; Vasculitis in her mother.  ROS:   Please see the history of present illness.    *** All other systems reviewed and are negative.  Labs/Other Studies Reviewed:    The following studies were reviewed today:  Cardiac monitor 03/14/22    Underlying rhythm is normal sinus rhythm   Rare PVC   Occasional bradycardia ( in early morning , likely while sleeping )   No serious arrhythmias observed    CT Cardiac Score 05/16/20  IMPRESSION: Coronary calcium score of 490. This was 76th percentile for age and sex matched control.    Recent Labs: 05/06/2022: ALT 12; BUN 10; Creatinine, Ser  0.79; Potassium 4.0; Sodium 141  Recent Lipid Panel    Component Value Date/Time   CHOL 173 05/06/2022 0940   TRIG 78 05/06/2022 0940   HDL 67 05/06/2022 0940   CHOLHDL 2.6 05/06/2022 0940   CHOLHDL 2.1 12/08/2020 0948   VLDL 16 12/08/2020 0948   LDLCALC 91 05/06/2022 0940     Risk Assessment/Calculations:   {Does this patient have ATRIAL FIBRILLATION?:[phone]}       Physical Exam:    VS:  There were no vitals taken for this visit.    Wt Readings from Last 3 Encounters:  04/17/22 113 lb 9.6 oz (51.5 kg)  02/05/22 112 lb 12.8 oz (51.2 kg)  04/19/20 111 lb 12.8 oz (50.7 kg)     GEN: *** Well nourished, well developed in no acute distress HEENT: Normal NECK: No JVD; No carotid bruits CARDIAC: ***RRR, no murmurs, rubs, gallops RESPIRATORY:  Clear to auscultation without rales, wheezing or rhonchi  ABDOMEN: Soft, non-tender, non-distended MUSCULOSKELETAL:  No edema; No deformity. *** pedal pulses, ***bilaterally SKIN: Warm and dry NEUROLOGIC:  Alert and oriented x 3 PSYCHIATRIC:  Normal affect   EKG:  EKG is *** ordered today.  The ekg ordered today demonstrates ***  No BP recorded.  {Refresh Note OR Click here to enter BP  :1}***    Diagnoses:    No diagnosis found. Assessment and Plan:     Hypertension: Hyperlipidemia LDL goal < 70: CAD: Coronary calcium score of 490 04/2020.  {Are you ordering a CV Procedure (e.g. stress test, cath, DCCV, TEE, etc)?   Press F2        :[id number]}   Disposition:  Medication Adjustments/Labs and Tests Ordered: Current medicines are reviewed at length with the patient today.  Concerns regarding medicines are outlined above.  No orders of the defined types were placed in this encounter.  No orders of the defined types were placed in this encounter.   There are no Patient Instructions on file for this visit.   Signed, Emmaline Life, NP  05/21/2022 5:45 AM    [place]

## 2022-05-30 NOTE — Progress Notes (Signed)
Cardiology Office Note:    Date:  05/31/2022   ID:  [name], DOB 1934/09/27, MRN [id number]  PCP:  Asencion Noble, MD   [facility] Providers Cardiologist:  Mertie Moores, MD     Referring MD: Asencion Noble, MD   Chief Complaint: fatigue  History of Present Illness:    [name] is a very pleasant 87 y.o. female with a hx of HTN, PSVT, elevated coronary calcium score, and HLD.  Referred to cardiology by PCP for management of hypertension and seen by Dr. Acie Fredrickson October 2021.  She reported some episodes of chest tightness and squeezing, usually occurring when she is stressed and lasting for 30 minutes or so. Typically she lies down to rest and the tightness improves. Pain did not intensify with exercise. Her daughter requested that she get a coronary calcium score which was completed 05/16/2020 and revealed coronary calcium score of 490, 76 percentile for age/sex. She did not wish to be started on a statin due to side effects reported by her son-in-law. She was referred to lipid clinic for management of hyperlipidemia  Last cardiology clinic visit was 04/17/2022 with Dr. Acie Fredrickson.  Patient reported dizziness in the mornings and was having palpitations thought to be SVT. BP elevated 2/2 back pain, thought to have some orthostasis in the mornings. Lisinopril was changed to valsartan. She called back to report elevated BP on 11/7. HCTZ 25 mg and Kdur 10 mEq was started and she was scheduled for follow-up visit.  Today, she is here with her daughter. Her daughter reports she frequently reports fatigue. Patient states that she takes care of her husband for the past 4 years since he had a stroke and she is 87 years old and thinks it is normal for her to be tired. Frequently reports dizziness when her daughter is with her during the day. Patient reports dizziness, particularly when she wakes up. Does not feel the room spinning. Does not feel particularly weak. Is aware of this and  is cautious not to make sudden movement. No increase in dizziness after taking medications. No syncope.  She denies chest pain, shortness of breath, lower extremity edema, palpitations, melena,  orthopnea, and PND. Admits she does not drink much water. Eats 2 large meals daily and snacks on fruit in between.   Past Medical History:  Diagnosis Date   Basal cell carcinoma    Chest pain    Diverticulosis    DJD (degenerative joint disease) of cervical spine    GERD (gastroesophageal reflux disease)    Head injury, closed    Headache(784.0)    Osteopenia    Pneumothorax 2006   PSVT (paroxysmal supraventricular tachycardia) 2003    Past Surgical History:  Procedure Laterality Date   ABDOMINAL HYSTERECTOMY     APPENDECTOMY     COLONOSCOPY     NISSEN FUNDOPLICATION     TONSILLECTOMY      Current Medications: Current Meds  Medication Sig   aspirin EC 81 MG tablet Take 81 mg by mouth daily. Swallow whole.   Cholecalciferol (VITAMIN D3) 125 MCG (5000 UT) CAPS Take by mouth.   gabapentin (NEURONTIN) 100 MG capsule Take 100 mg by mouth as needed.   hydrochlorothiazide (HYDRODIURIL) 25 MG tablet Take 1 tablet (25 mg total) by mouth daily.   metoprolol succinate (TOPROL XL) 25 MG 24 hr tablet Take 1 tablet (25 mg total) by mouth daily.   naproxen sodium (ANAPROX) 220 MG tablet Take 220 mg by mouth daily  as needed. For pain   potassium chloride (KLOR-CON) 10 MEQ tablet Take 1 tablet (10 mEq total) by mouth daily. Along with hydrochlorothiazide   rosuvastatin (CRESTOR) 5 MG tablet Take 1 tablet (5 mg total) by mouth daily.   valsartan (DIOVAN) 160 MG tablet Take 1 tablet (160 mg total) by mouth daily.     Allergies:   Glucose, Morphine, Sulfonamide derivatives, Vancomycin, Wheat bran, and Penicillins   Social History   Socioeconomic History   Marital status: Married    Spouse name: Myles   Number of children: 4   Years of education: College   Highest education level: Not on file   Occupational History   Occupation: [employer]    Employer: RETIRED  Tobacco Use   Smoking status: Never   Smokeless tobacco: Never  Substance and Sexual Activity   Alcohol use: No   Drug use: No   Sexual activity: Not on file  Other Topics Concern   Not on file  Social History Narrative   No regular exercise   Patient lives at home with spouse.   Caffeine Use:   Social Determinants of Health   Financial Resource Strain: Not on file  Food Insecurity: Not on file  Transportation Needs: Not on file  Physical Activity: Not on file  Stress: Not on file  Social Connections: Not on file     Family History: The patient's family history includes Arthritis in her mother and sister; Asthma in her sister; Heart disease in her father and sister; Kidney failure in her brother; Parkinsonism in her father; Vasculitis in her mother.  ROS:   Please see the history of present illness.    + dizziness All other systems reviewed and are negative.  Labs/Other Studies Reviewed:    The following studies were reviewed today:  Cardiac monitor 03/14/22    Underlying rhythm is normal sinus rhythm   Rare PVC   Occasional bradycardia ( in early morning , likely while sleeping )   No serious arrhythmias observed    CT Cardiac Score 05/16/20  IMPRESSION: Coronary calcium score of 490. This was 76th percentile for age and sex matched control.    Recent Labs: 05/06/2022: ALT 12; BUN 10; Creatinine, Ser 0.79; Potassium 4.0; Sodium 141  Recent Lipid Panel    Component Value Date/Time   CHOL 173 05/06/2022 0940   TRIG 78 05/06/2022 0940   HDL 67 05/06/2022 0940   CHOLHDL 2.6 05/06/2022 0940   CHOLHDL 2.1 12/08/2020 0948   VLDL 16 12/08/2020 0948   LDLCALC 91 05/06/2022 0940     Risk Assessment/Calculations:      Physical Exam:    VS:  BP 128/74   Pulse 71   Ht 5' (1.524 m)   Wt 114 lb (51.7 kg)   SpO2 96%   BMI 22.26 kg/m     Wt Readings from Last 3 Encounters:  05/31/22 114  lb (51.7 kg)  04/17/22 113 lb 9.6 oz (51.5 kg)  02/05/22 112 lb 12.8 oz (51.2 kg)     GEN:  Well nourished, well developed in no acute distress HEENT: Normal NECK: No JVD; No carotid bruits CARDIAC: RRR, no murmurs, rubs, gallops RESPIRATORY:  Clear to auscultation without rales, wheezing or rhonchi  ABDOMEN: Soft, non-tender, non-distended MUSCULOSKELETAL:  No edema; No deformity. 2+ pedal pulses, equal bilaterally SKIN: Warm and dry NEUROLOGIC:  Alert and oriented x 3 PSYCHIATRIC:  Normal affect   EKG:  EKG is not ordered today.    Diagnoses:  1. Left carotid bruit   2. Heart palpitations   3. PSVT (paroxysmal supraventricular tachycardia)   4. Essential hypertension   5. Dizziness   6. Hyperlipidemia LDL goal <70    Assessment and Plan:     Left carotid bruit: Has a left carotid bruit. I do not see prior documentation of this. She is also having some dizziness, especially upon waking up. Will get carotid ultrasound for further evaluation. Continue aspirin and rosuvastatin.   Dizziness: Is having dizziness when she wakes up. Will check carotid u/s for stenosis. Encouraged her to increase water intake to 48-60 oz daily. She avoids high sodium foods   Hypertension: BP readings from home reviewed. Overall, these are well-controlled. No medication changes today.   Hyperlipidemia LDL goal < 70: LDL 91 on 05/06/22, improved from 157 previously. Continue rosuvastatin.   PSVT/Palpitations: Quiescent at this time. Continue metoprolol.  CAD without angina: Coronary calcium score of 490 04/2020. She denies chest pain, dyspnea, or other symptoms concerning for angina.  No indication for further ischemic evaluation at this time.     Disposition: 6 months with Dr. Acie Fredrickson  Medication Adjustments/Labs and Tests Ordered: Current medicines are reviewed at length with the patient today.  Concerns regarding medicines are outlined above.  Orders Placed This Encounter  Procedures   VAS  US CAROTID   No orders of the defined types were placed in this encounter.   Patient Instructions  Medication Instructions:   Your physician recommends that you continue on your current medications as directed. Please refer to the Current Medication list given to you today.   *If you need a refill on your cardiac medications before your next appointment, please call your pharmacy*   Lab Work:  None ordered.  If you have labs (blood work) drawn today and your tests are completely normal, you will receive your results only by: [place] (if you have MyChart) OR A paper copy in the mail If you have any lab test that is abnormal or we need to change your treatment, we will call you to review the results.   Testing/Procedures:  Your physician has requested that you have a carotid duplex. This test is an ultrasound of the carotid arteries in your neck. It looks at blood flow through these arteries that supply the brain with blood. Allow one hour for this exam. There are no restrictions or special instructions. [place].     Follow-Up: At [facility], you and your health needs are our priority.  As part of our continuing mission to provide you with exceptional heart care, we have created designated Provider Care Teams.  These Care Teams include your primary Cardiologist (physician) and Advanced Practice Providers (APPs -  Physician Assistants and Nurse Practitioners) who all work together to provide you with the care you need, when you need it.  We recommend signing up for the patient portal called "MyChart".  Sign up information is provided on this After Visit Summary.  MyChart is used to connect with patients for Virtual Visits (Telemedicine).  Patients are able to view lab/test results, encounter notes, upcoming appointments, etc.  Non-urgent messages can be sent to your provider as well.   To learn more about what you can do with MyChart, go to  [email or web address].    Your next appointment:   6 month(s)  The format for your next appointment:   In Person  Provider:   Mertie Moores, MD      Important Information About Sugar  Signed, Emmaline Life, NP  05/31/2022 3:57 PM    [place]

## 2022-05-31 ENCOUNTER — Other Ambulatory Visit: Payer: Medicare Other

## 2022-05-31 ENCOUNTER — Encounter: Payer: Self-pay | Admitting: Nurse Practitioner

## 2022-05-31 ENCOUNTER — Ambulatory Visit: Payer: Medicare Other | Attending: Nurse Practitioner | Admitting: Nurse Practitioner

## 2022-05-31 VITALS — BP 128/74 | HR 71 | Ht 60.0 in | Wt 114.0 lb

## 2022-05-31 DIAGNOSIS — R0989 Other specified symptoms and signs involving the circulatory and respiratory systems: Secondary | ICD-10-CM

## 2022-05-31 DIAGNOSIS — I10 Essential (primary) hypertension: Secondary | ICD-10-CM | POA: Diagnosis not present

## 2022-05-31 DIAGNOSIS — R42 Dizziness and giddiness: Secondary | ICD-10-CM

## 2022-05-31 DIAGNOSIS — E785 Hyperlipidemia, unspecified: Secondary | ICD-10-CM | POA: Diagnosis not present

## 2022-05-31 DIAGNOSIS — I471 Supraventricular tachycardia, unspecified: Secondary | ICD-10-CM | POA: Diagnosis not present

## 2022-05-31 DIAGNOSIS — R002 Palpitations: Secondary | ICD-10-CM

## 2022-05-31 NOTE — Addendum Note (Signed)
Addended by: Tamsen Snider on: 05/31/2022 04:02 PM   Modules accepted: Orders

## 2022-05-31 NOTE — Patient Instructions (Signed)
Medication Instructions:   Your physician recommends that you continue on your current medications as directed. Please refer to the Current Medication list given to you today.   *If you need a refill on your cardiac medications before your next appointment, please call your pharmacy*   Lab Work:  None ordered.  If you have labs (blood work) drawn today and your tests are completely normal, you will receive your results only by: [place] (if you have MyChart) OR A paper copy in the mail If you have any lab test that is abnormal or we need to change your treatment, we will call you to review the results.   Testing/Procedures:  Your physician has requested that you have a carotid duplex. This test is an ultrasound of the carotid arteries in your neck. It looks at blood flow through these arteries that supply the brain with blood. Allow one hour for this exam. There are no restrictions or special instructions. [place].     Follow-Up: At [facility], you and your health needs are our priority.  As part of our continuing mission to provide you with exceptional heart care, we have created designated Provider Care Teams.  These Care Teams include your primary Cardiologist (physician) and Advanced Practice Providers (APPs -  Physician Assistants and Nurse Practitioners) who all work together to provide you with the care you need, when you need it.  We recommend signing up for the patient portal called "MyChart".  Sign up information is provided on this After Visit Summary.  MyChart is used to connect with patients for Virtual Visits (Telemedicine).  Patients are able to view lab/test results, encounter notes, upcoming appointments, etc.  Non-urgent messages can be sent to your provider as well.   To learn more about what you can do with MyChart, go to [email or web address].    Your next appointment:   6 month(s)  The format for your next appointment:   In  Person  Provider:   Mertie Moores, MD      Important Information About Sugar

## 2022-06-07 ENCOUNTER — Ambulatory Visit (HOSPITAL_COMMUNITY)
Admission: RE | Admit: 2022-06-07 | Discharge: 2022-06-07 | Disposition: A | Discharge status: Home / Self Care | Payer: Medicare Other | Source: Ambulatory Visit | Attending: Nurse Practitioner | Admitting: Nurse Practitioner

## 2022-06-07 DIAGNOSIS — R0989 Other specified symptoms and signs involving the circulatory and respiratory systems: Secondary | ICD-10-CM | POA: Insufficient documentation

## 2022-06-07 DIAGNOSIS — I771 Stricture of artery: Secondary | ICD-10-CM | POA: Diagnosis not present

## 2022-06-07 DIAGNOSIS — I6523 Occlusion and stenosis of bilateral carotid arteries: Secondary | ICD-10-CM | POA: Diagnosis not present

## 2022-06-13 ENCOUNTER — Telehealth: Payer: Self-pay | Admitting: Cardiovascular Disease

## 2022-06-13 DIAGNOSIS — Z79899 Other long term (current) drug therapy: Secondary | ICD-10-CM

## 2022-06-13 DIAGNOSIS — E785 Hyperlipidemia, unspecified: Secondary | ICD-10-CM

## 2022-06-13 NOTE — Telephone Encounter (Signed)
-----   Message from Thayer Headings, MD sent at 06/11/2022  7:44 AM EST ----- No evidence of significant carotid stenosis to explain her symptoms of early morning dizziness.  I suspect she is volume depleted early in the morning  She is already on ASA. Her LDL is 91 .  Her goal LDL is 50-70 Would see if she tolerates Rosuvastatin 10 mg a day or alternatively , add Zetia 10 mg a day. Check lipids / ALT in 3 months

## 2022-06-13 NOTE — Telephone Encounter (Signed)
Called and spoke with daughter [name] on [place] per pt's request. Shrul states that they do not wish to increase the statin or start Zetia at this time. Says that a couple family members have had "detrimental effects" of taking statins. She would like to wait another 3 months to see if the '5mg'$  Crestor that she's taking since August does any more at bringing down LDL. Advised that Zetia isn't a statin, but she again states that she feels mom is doing "overall poorly" due to being on "so  many medications already." Will place orders for repeat lipids and ALT to be done March 2024. She wants to get them done at [facility] since they live in [place]. Will accommodate this with the order.

## 2022-07-04 ENCOUNTER — Other Ambulatory Visit (HOSPITAL_COMMUNITY): Payer: Self-pay

## 2022-07-18 ENCOUNTER — Ambulatory Visit: Payer: Medicare Other | Admitting: Nurse Practitioner

## 2022-07-22 ENCOUNTER — Telehealth: Payer: Self-pay | Admitting: Cardiovascular Disease

## 2022-07-22 DIAGNOSIS — E785 Hyperlipidemia, unspecified: Secondary | ICD-10-CM

## 2022-07-22 DIAGNOSIS — I10 Essential (primary) hypertension: Secondary | ICD-10-CM

## 2022-07-22 DIAGNOSIS — I471 Supraventricular tachycardia, unspecified: Secondary | ICD-10-CM

## 2022-07-22 NOTE — Telephone Encounter (Signed)
Pt seen in office 06/01/23 by Christen Bame:  Referred to cardiology by PCP for management of hypertension and seen by Dr. Acie Fredrickson October 2021.  She reported some episodes of chest tightness and squeezing, usually occurring when she is stressed and lasting for 30 minutes or so. Typically she lies down to rest and the tightness improves. Pain did not intensify with exercise. Her daughter requested that she get a coronary calcium score which was completed 05/16/2020 and revealed coronary calcium score of 490, 76 percentile for age/sex. She did not wish to be started on a statin due to side effects reported by her son-in-law. She was referred to lipid clinic for management of hyperlipidemia  Last cardiology clinic visit was 04/17/2022 with Dr. Acie Fredrickson.  Patient reported dizziness in the mornings and was having palpitations thought to be SVT. BP elevated 2/2 back pain, thought to have some orthostasis in the mornings. Lisinopril was changed to valsartan. She called back to report elevated BP on 11/7. HCTZ 25 mg and Kdur 10 mEq was started and she was scheduled for follow-up visit.Admits she does not drink much water. Eats 2 large meals daily and snacks on fruit in between.    Hypertension: BP readings from home reviewed. Overall, these are well-controlled. No medication changes today.   Routing to MD for review to see if ordering requested lab is appropriate.

## 2022-07-22 NOTE — Telephone Encounter (Signed)
Patient's daughter calling to request a PRA test for renin. Phone: [phone]

## 2022-07-23 NOTE — Telephone Encounter (Signed)
Nahser, Wonda Cheng, MD  Lenell Antu hours ago (5:26 PM)    So far , there is no evidence that she has a Renin or aldosterone secreting tumor.  Her potassium levels would be low  We will continue to watch for how she is doing , especially after the addition of the HCTZ and Kdur. If her potassium levels drop , I would substitue Spironolactone ( which is the treatment for renin or aldosterone excess .  PN   Returned call to daughter Laurell Josephs and spoke at length about above information. She states it was her sister that called and not her, but she appreciates the call and will relay information to her sister. She verbalizes a lot of upset over the state of her mom. Shyrl states that her mom "never has good days anymore." She condones that her mom used to have days where she felt good more than not, but since being placed "on all these medications" she is tired, fatigued, just not herself and she is very concerned. Also states that mother has short term memory loss and she has been researching her medications and found that statins and Metoprolol Succinate both cause memory impairment "according to the CDC" and other articles online. I expressed that I wasn't aware of these categories causing memory loss, but I would ask our pharmacy team to reach out to her to discuss. She would like more information than what I can provide and wants to know other options that would protect her mother. I did advise however, that IF these drugs carried the POTENTIAL side effects of memory loss, we have to weigh risk vs benefit in caring for her mother and balance out quality of life vs quantity to ensure we take care of the patient in the way she would prefer. Shyrl was not at all upset and understood the point I was making, but again, wants more information than I can provide. Will route to PharmD and ask them to reach out to her to discuss these potential side effects.

## 2022-07-25 NOTE — Telephone Encounter (Addendum)
Returned call to pt's daughter. Pt has been experiencing fatigue and short term memory issues. Fatigue has been off and on since last fall. Metoprolol and rosuvastatin both rx by Dr Acie Fredrickson February 05 2022 visit. Discussed that gabapentin can cause these side effects too but she reports pt hasn't been taking. She was rx metoprolol for her palpitations and rosuvastatin for her elevated CAC. Not sure that her symptoms are 100% medication related but if they've been present since last fall when she was started on both meds, will try cutting back on doses of each med and see if symptoms improve over the next few weeks. Pt will cut both metoprolol and rosuvastatin in half and take metoprolol 12.'5mg'$  daily and rosuvastatin 2.'5mg'$  daily. Her daughter will call with symptom update in a few weeks. She was appreciative for the call.

## 2022-12-02 ENCOUNTER — Telehealth: Payer: Self-pay

## 2022-12-02 ENCOUNTER — Other Ambulatory Visit (HOSPITAL_COMMUNITY)
Admission: RE | Admit: 2022-12-02 | Discharge: 2022-12-02 | Disposition: A | Discharge status: Home / Self Care | Payer: Medicare Other | Source: Ambulatory Visit | Attending: Cardiovascular Disease | Admitting: Cardiovascular Disease

## 2022-12-02 DIAGNOSIS — Z79899 Other long term (current) drug therapy: Secondary | ICD-10-CM | POA: Insufficient documentation

## 2022-12-02 LAB — BASIC METABOLIC PANEL
Anion gap: 9 (ref 5–15)
BUN: 11 mg/dL (ref 8–23)
CO2: 29 mmol/L (ref 22–32)
Calcium: 9.1 mg/dL (ref 8.9–10.3)
Chloride: 100 mmol/L (ref 98–111)
Creatinine, Ser: 0.75 mg/dL (ref 0.44–1.00)
GFR, Estimated: >60 mL/min (ref >60)
Glucose, Bld: 69 mg/dL — ABNORMAL LOW (ref 70–99)
Potassium: 3.2 mmol/L — ABNORMAL LOW (ref 3.5–5.1)
Sodium: 138 mmol/L (ref 135–145)

## 2022-12-02 MED ORDER — SPIRONOLACTONE 25 MG PO TABS: 25.0000 mg | ORAL_TABLET | Freq: Every day | ORAL | 3 refills | Status: DC

## 2022-12-02 NOTE — Telephone Encounter (Signed)
I spoke with the pts daughter and per the DOD.Marland Kitchen Dr Graciela Husbands.. since the pt took her HCTZ today and her K 10 meq.. she will take an extra K today and tomorrow and will also start the Spironolactone tomorrow... she has a follow up OV with Dr Elease Hashimoto 12/04/22 and will let her know when to stop the K.

## 2022-12-03 ENCOUNTER — Encounter: Payer: Self-pay | Admitting: Cardiovascular Disease

## 2022-12-03 NOTE — Progress Notes (Signed)
Cardiology Office Note:    Date:  12/04/2022   ID:  [name], DOB 1934-11-11, MRN [id number]  PCP:  Carylon Perches, MD  [facility] Cardiologist:  Luvenia Heller  [facility] Electrophysiologist:  None   Referring MD: Carylon Perches, MD   Chief Complaint  Patient presents with   Hypertension         Oct. 27, 2021   [name] is a 88 y.o. female with a hx of HTN, and PSVT .  We were asked to see her by Dr. Ouida Sills for further evaluation and management of her HTN. Seen with daughter, Clydie Braun   She has episodes of variable BP .  She is on 2 BP meds.   Eats salt regularly   Some episodes of chest tightness, squeezing  Usually occurs when she is stressed . Lasts for 30 minutes or so . Typically she lies down to rest and the tightness improves.  Not related to exercise   Has some forgetfulness  No signs or symptoms of stroke   Aug. 15, 2023 [name] is Seen with son in [place],  Floreen Comber  is seen for follow up of her HTN, PSVT  Had 1 severe episode of SVT .   Lasted 10 minutes or so  Woke up with this fast HR   Family takes breakfast over every day,  she relates episodes to the family   Has frequent episodes of dizziness, especially in the morming  Symptoms are c/w orthostasis   Has a CAC of 490 .   Oct. 25, 2023 [name] is seen for follow up of her HTN, SVT Was having significant SVT when I saw her in Aug  Has palpitations on occasion , She has not mentioned it to her daughter recently  Lots of back pain  As a result, her BP is elevated.   Avoids salty foods for the most part   Will DC Lisinopril 20 mg a day and start valsartan 160 mg a day    December 04, 2022 [name] is seen for follow up visit for her HTN, SVT Seen with daughter, [name].   She has not been feeling well Has some forgetfulness Has lots of dizzyness Balance issues at time almost always when standing up.  Her heart rate is 67 which may be a little bit low for her at the age  of 88.  She is on Toprol-XL 12.5 mg a day, spironolactone 25 mg a day, Diovan 160 mg a day   She is having fatigue , dizziness Will DC the Toprol XL to see if that helps  Will have her return in 4 to 6 weeks to see an APP.  If her symptoms have resolved and will continue as she is.  If she continues to have lightheadedness especially with standing then we will continue to taper down her valsartan and perhaps spironolactone.   Past Medical History:  Diagnosis Date   Basal cell carcinoma    Chest pain    Diverticulosis    DJD (degenerative joint disease) of cervical spine    GERD (gastroesophageal reflux disease)    Head injury, closed    Headache(784.0)    Osteopenia    Pneumothorax 2006   PSVT (paroxysmal supraventricular tachycardia) 2003    Past Surgical History:  Procedure Laterality Date   ABDOMINAL HYSTERECTOMY     APPENDECTOMY     COLONOSCOPY     NISSEN FUNDOPLICATION     TONSILLECTOMY      Current  Medications: Current Meds  Medication Sig   aspirin EC 81 MG tablet Take 81 mg by mouth daily. Swallow whole.   Cholecalciferol (VITAMIN D3) 125 MCG (5000 UT) CAPS Take by mouth.   gabapentin (NEURONTIN) 100 MG capsule Take 100 mg by mouth as needed.   naproxen sodium (ANAPROX) 220 MG tablet Take 220 mg by mouth daily as needed. For pain   potassium chloride (KLOR-CON) 10 MEQ tablet Take 1 tablet (10 mEq total) by mouth daily. Along with hydrochlorothiazide   rosuvastatin (CRESTOR) 5 MG tablet Take 0.5 tablets (2.5 mg total) by mouth daily.   spironolactone (ALDACTONE) 25 MG tablet Take 1 tablet (25 mg total) by mouth daily.   valsartan (DIOVAN) 160 MG tablet Take 1 tablet (160 mg total) by mouth daily.   [DISCONTINUED] metoprolol succinate (TOPROL XL) 25 MG 24 hr tablet Take 0.5 tablets (12.5 mg total) by mouth daily.     Allergies:   Glucose, Morphine, Sulfonamide derivatives, Vancomycin, Wheat, and Penicillins   Social History   Socioeconomic History   Marital  status: Married    Spouse name: Myles   Number of children: 4   Years of education: College   Highest education level: Not on file  Occupational History   Occupation: [employer]    Employer: RETIRED  Tobacco Use   Smoking status: Never   Smokeless tobacco: Never  Substance and Sexual Activity   Alcohol use: No   Drug use: No   Sexual activity: Not on file  Other Topics Concern   Not on file  Social History Narrative   No regular exercise   Patient lives at home with spouse.   Caffeine Use:   Social Determinants of Health   Financial Resource Strain: Not on file  Food Insecurity: Not on file  Transportation Needs: Not on file  Physical Activity: Not on file  Stress: Not on file  Social Connections: Not on file     Family History: The patient's family history includes Arthritis in her mother and sister; Asthma in her sister; Heart disease in her father and sister; Kidney failure in her brother; Parkinsonism in her father; Vasculitis in her mother.  ROS:   Please see the history of present illness.     All other systems reviewed and are negative.  EKGs/Labs/Other Studies Reviewed:    The following studies were reviewed today:     Recent Labs: 05/06/2022: ALT 12 12/02/2022: BUN 11; Creatinine, Ser 0.75; Potassium 3.2; Sodium 138  Recent Lipid Panel    Component Value Date/Time   CHOL 173 05/06/2022 0940   TRIG 78 05/06/2022 0940   HDL 67 05/06/2022 0940   CHOLHDL 2.6 05/06/2022 0940   CHOLHDL 2.1 12/08/2020 0948   VLDL 16 12/08/2020 0948   LDLCALC 91 05/06/2022 0940     Risk Assessment/Calculations:       Physical Exam:    Physical Exam: Blood pressure 128/70, pulse 67, height 5' (1.524 m), SpO2 97 %.       GEN:  Well nourished, well developed in no acute distress HEENT: Normal NECK: No JVD; No carotid bruits LYMPHATICS: No lymphadenopathy CARDIAC: RRR , no murmurs, rubs, gallops RESPIRATORY:  Clear to auscultation without rales, wheezing or  rhonchi  ABDOMEN: Soft, non-tender, non-distended MUSCULOSKELETAL:  No edema; No deformity  SKIN: Warm and dry NEUROLOGIC:  Alert and oriented x 3     EKG:    December 04, 2022: Normal sinus rhythm at 67.  Nonspecific T wave abnormality.   ASSESSMENT:  1. PSVT (paroxysmal supraventricular tachycardia)   2. Hyperlipidemia, unspecified hyperlipidemia type   3. Hypertension, unspecified type      PLAN:      HTN:  well controlled.       2.  Hyperlipidemia: stable    3.  Orthostatic hypotension:  . She is having fatigue , dizziness Will DC the Toprol XL to see if that helps  Will have her return in 4 to 6 weeks to see an APP.  If her symptoms have resolved and will continue as she is.  If she continues to have lightheadedness especially with standing then we will continue to taper down her valsartan and perhaps spironolactone.     Her daughter will let us know if she has any additional issues Will give her instructions to reset her mychart login      Medication Adjustments/Labs and Tests Ordered: Current medicines are reviewed at length with the patient today.  Concerns regarding medicines are outlined above.  Orders Placed This Encounter  Procedures   Basic metabolic panel   EKG 12-Lead   No orders of the defined types were placed in this encounter.     Patient Instructions  Medication Instructions:  STOP metoprolol succinate (Toprol).  *If you need a refill on your cardiac medications before your next appointment, please call your pharmacy*   Lab Work: BMET today.  If you have labs (blood work) drawn today and your tests are completely normal, you will receive your results only by: MyChart Message (if you have MyChart) OR A paper copy in the mail If you have any lab test that is abnormal or we need to change your treatment, we will call you to review the results.   Testing/Procedures: None   Follow-Up: At [facility], you and your  health needs are our priority.  As part of our continuing mission to provide you with exceptional heart care, we have created designated Provider Care Teams.  These Care Teams include your primary Cardiologist (physician) and Advanced Practice Providers (APPs -  Physician Assistants and Nurse Practitioners) who all work together to provide you with the care you need, when you need it.    Your next appointment:   6 week(s)  Provider:   APP Jari Favre, PA, Robin Searing, NP, or Eligha Bridegroom NP     Signed, Kristeen Miss, MD  12/04/2022 5:53 PM    [facility]

## 2022-12-04 ENCOUNTER — Encounter: Payer: Self-pay | Admitting: Cardiovascular Disease

## 2022-12-04 ENCOUNTER — Ambulatory Visit: Payer: Medicare Other | Attending: Cardiovascular Disease | Admitting: Cardiovascular Disease

## 2022-12-04 VITALS — BP 128/70 | HR 67 | Ht 60.0 in

## 2022-12-04 DIAGNOSIS — I10 Essential (primary) hypertension: Secondary | ICD-10-CM | POA: Diagnosis not present

## 2022-12-04 DIAGNOSIS — E785 Hyperlipidemia, unspecified: Secondary | ICD-10-CM | POA: Diagnosis not present

## 2022-12-04 DIAGNOSIS — I471 Supraventricular tachycardia, unspecified: Secondary | ICD-10-CM

## 2022-12-04 NOTE — Patient Instructions (Addendum)
Medication Instructions:  STOP metoprolol succinate (Toprol).  *If you need a refill on your cardiac medications before your next appointment, please call your pharmacy*   Lab Work: BMET today.  If you have labs (blood work) drawn today and your tests are completely normal, you will receive your results only by: MyChart Message (if you have MyChart) OR A paper copy in the mail If you have any lab test that is abnormal or we need to change your treatment, we will call you to review the results.   Testing/Procedures: None   Follow-Up: At [facility], you and your health needs are our priority.  As part of our continuing mission to provide you with exceptional heart care, we have created designated Provider Care Teams.  These Care Teams include your primary Cardiologist (physician) and Advanced Practice Providers (APPs -  Physician Assistants and Nurse Practitioners) who all work together to provide you with the care you need, when you need it.    Your next appointment:   6 week(s)  Provider:   APP Jari Favre, PA, Robin Searing, NP, or Eligha Bridegroom NP

## 2022-12-05 ENCOUNTER — Telehealth: Payer: Self-pay

## 2022-12-05 ENCOUNTER — Other Ambulatory Visit: Payer: Self-pay

## 2022-12-05 DIAGNOSIS — Z79899 Other long term (current) drug therapy: Secondary | ICD-10-CM

## 2022-12-05 LAB — BASIC METABOLIC PANEL
BUN/Creatinine Ratio: 15 (ref 12–28)
BUN: 11 mg/dL (ref 8–27)
CO2: 24 mmol/L (ref 20–29)
Calcium: 9.6 mg/dL (ref 8.7–10.3)
Chloride: 101 mmol/L (ref 96–106)
Creatinine, Ser: 0.72 mg/dL (ref 0.57–1.00)
Glucose: 113 mg/dL — ABNORMAL HIGH (ref 70–99)
Potassium: 4.6 mmol/L (ref 3.5–5.2)
Sodium: 140 mmol/L (ref 134–144)
eGFR: 81 mL/min/{1.73_m2} (ref >59)

## 2022-12-05 NOTE — Telephone Encounter (Signed)
Called spoke to the pt daughter [facility] . Explained Dr. Elease Hashimoto recommendations:  Potassium is now normal Stop Kdur Continue spironolactone. Please recheck BMP in 3-4 weeks to make sure it stays normal while on Spironolactone Continue valsarta  Pt daughter stated she will like to have pt lab drawn at [facility] in [place]. Order placed for repeat BMP, pt DPR voiced understanding.

## 2022-12-06 NOTE — Telephone Encounter (Signed)
Potassium removed from [facility] and spoke with daughter Orbie Hurst to verify she understood which one to stop-all questions answered.

## 2022-12-06 NOTE — Telephone Encounter (Signed)
-----   Message from Vesta Mixer, MD sent at 12/05/2022  6:53 AM EDT ----- Potassium is now normal  Stop Kdur Continue spironolactone.  Please recheck BMP in 3-4 weeks to make sure it stays normal while on Spironolactone Continue valsartan

## 2022-12-31 ENCOUNTER — Other Ambulatory Visit (HOSPITAL_COMMUNITY)
Admission: RE | Admit: 2022-12-31 | Discharge: 2022-12-31 | Disposition: A | Discharge status: Home / Self Care | Payer: Medicare Other | Source: Ambulatory Visit | Attending: Cardiovascular Disease | Admitting: Cardiovascular Disease

## 2022-12-31 DIAGNOSIS — Z79899 Other long term (current) drug therapy: Secondary | ICD-10-CM | POA: Insufficient documentation

## 2022-12-31 LAB — BASIC METABOLIC PANEL
Anion gap: 8 (ref 5–15)
BUN: 17 mg/dL (ref 8–23)
CO2: 28 mmol/L (ref 22–32)
Calcium: 9 mg/dL (ref 8.9–10.3)
Chloride: 99 mmol/L (ref 98–111)
Creatinine, Ser: 0.84 mg/dL (ref 0.44–1.00)
GFR, Estimated: >60 mL/min (ref >60)
Glucose, Bld: 97 mg/dL (ref 70–99)
Potassium: 4.7 mmol/L (ref 3.5–5.1)
Sodium: 135 mmol/L (ref 135–145)

## 2023-01-20 ENCOUNTER — Encounter: Payer: Self-pay | Admitting: Physician Assistant

## 2023-01-20 ENCOUNTER — Ambulatory Visit: Payer: Medicare Other | Attending: Physician Assistant | Admitting: Physician Assistant

## 2023-01-20 VITALS — BP 118/66 | HR 75 | Ht 60.0 in | Wt 116.0 lb

## 2023-01-20 DIAGNOSIS — R002 Palpitations: Secondary | ICD-10-CM | POA: Diagnosis not present

## 2023-01-20 DIAGNOSIS — E785 Hyperlipidemia, unspecified: Secondary | ICD-10-CM

## 2023-01-20 DIAGNOSIS — Z79899 Other long term (current) drug therapy: Secondary | ICD-10-CM | POA: Diagnosis not present

## 2023-01-20 DIAGNOSIS — I10 Essential (primary) hypertension: Secondary | ICD-10-CM

## 2023-01-20 DIAGNOSIS — I471 Supraventricular tachycardia, unspecified: Secondary | ICD-10-CM | POA: Diagnosis not present

## 2023-01-20 MED ORDER — VALSARTAN 80 MG PO TABS: 80.0000 mg | ORAL_TABLET | Freq: Every day | ORAL | 3 refills | Status: DC

## 2023-01-20 NOTE — Progress Notes (Signed)
Cardiology Office Note:  .   Date:  01/20/2023  ID:  [name], DOB 18-Jul-1934, MRN [id number] PCP: Donna Perches, MD  [facility] Providers Cardiologist:  Donna Miss, MD {  History of Present Illness: .   [name] is a 88 y.o. female with a past medical history of hypertension and PSVT.  Seen originally for management of hypertension.  Variable BP and on 2 blood pressure medications.  Was last seen by Dr. Elease Powers a month ago and at that time was having variable BP, eating salt regularly.  Some episodes of chest tightness/squeezing.  Usually occurred when stressed.  Lasts for about 30 minutes or so.  Typically she lies down at this time to rest and the tightness is sides.  Not related to exercise.  Some forgetfulness.  No signs or symptoms of stroke.  Was seen February 05, 2022 and at that time had 1 severe episode of SVT, lasted 10 minutes or so.  Woke up with fast heart rate.  Was having frequent episodes of dizziness specially in the morning.  Symptoms were consistent with orthostasis.  CAC of 490.  Seen April 17, 2022 and at that time was having palpitations on occasion.  Lots of back pain.  As result of the pain, blood pressure has been elevated.  Try to avoid salty foods for the most part.  Lisinopril was discontinued and she was started on valsartan.  Seen December 04, 2022 and at that time was not feeling well.  Had some forgetfulness and lots of dizziness.  Balance issues.  Heart rate was 67 which is low for her age.  On metoprolol succinate 12.5 mg daily, spironolactone 25 mg daily, and Diovan 160 mg daily.  Having less fatigue and dizziness.  Metoprolol was discontinued at that time.  Close follow-up was arranged.  Today, she tells me that she is a very active person, she gets more dizzy in the morning or when she is getting up from her nap. She does try to stay hydrated. She has a low normal BP today, HR is much better off metoprolol, will continue to hold.  She still has some fatigue.  Reports no shortness of breath nor dyspnea on exertion. Reports no chest pain, pressure, or tightness. No edema, orthopnea, PND. Reports no palpitations.    ROS: Pertinent ROS located in HPI  Studies Reviewed: .       Cardiac monitor 02/05/22   Underlying rhythm is normal sinus rhythm   Rare PVC   Occasional bradycardia ( in early morning , likely while sleeping )   No serious arrhythmias observed      Physical Exam:   VS:  BP 118/66   Pulse 75   Ht 5' (1.524 m)   Wt 116 lb (52.6 kg)   SpO2 96%   BMI 22.65 kg/m    Wt Readings from Last 3 Encounters:  01/20/23 116 lb (52.6 kg)  05/31/22 114 lb (51.7 kg)  04/17/22 113 lb 9.6 oz (51.5 kg)    GEN: Well nourished, well developed in no acute distress NECK: No JVD; No carotid bruits CARDIAC: RRR, no murmurs, rubs, gallops RESPIRATORY:  Clear to auscultation without rales, wheezing or rhonchi  ABDOMEN: Soft, non-tender, non-distended EXTREMITIES:  No edema; No deformity   ASSESSMENT AND PLAN: .   1.  PSVT -well controlled on current medications, HR slightly higher now off metoprolol  2.  Hyperlipidemia -continue crestor 2.5mg  daily -LDL 91, no CAD, at goal  3.  Hypertension/orthostatic hypotension -will decrease diovan to 80mg  daily and have the patient track her BP for 2 weeks and send me those values -continue proper hydration -continue other medications   Dispo: She can follow-up in 6 weeks for BP check  Signed, Sharlene Dory, PA-C

## 2023-01-20 NOTE — Patient Instructions (Addendum)
Medication Instructions:  Your physician has recommended you make the following change in your medication:  DECREASE Valsartan (Diovan) 80mg , take 1(one) tablet by mouth daily.  *If you need a refill on your cardiac medications before your next appointment, please call your pharmacy*   Lab Work: None  If you have labs (blood work) drawn today and your tests are completely normal, you will receive your results only by: MyChart Message (if you have MyChart) OR A paper copy in the mail If you have any lab test that is abnormal or we need to change your treatment, we will call you to review the results.   Testing/Procedures: None   Follow-Up: At [facility], you and your health needs are our priority.  As part of our continuing mission to provide you with exceptional heart care, we have created designated Provider Care Teams.  These Care Teams include your primary Cardiologist (physician) and Advanced Practice Providers (APPs -  Physician Assistants and Nurse Practitioners) who all work together to provide you with the care you need, when you need it.  We recommend signing up for the patient portal called "MyChart".  Sign up information is provided on this After Visit Summary.  MyChart is used to connect with patients for Virtual Visits (Telemedicine).  Patients are able to view lab/test results, encounter notes, upcoming appointments, etc.  Non-urgent messages can be sent to your provider as well.   To learn more about what you can do with MyChart, go to [email or web address].    Your next appointment:   6 week(s)  Provider:   Jari Favre, PA-C         Other Instructions Your physician has requested that you regularly monitor and record your blood pressure readings at home. Please use the same machine at the same time of day to check your readings and record them to bring to your follow-up visit.   Please monitor blood pressures and keep a log of your readings for 2(two)  weeks and send readings to Jari Favre, PA via [place].   Make sure to check 2 hours after your medications.    AVOID these things for 30 minutes before checking your blood pressure: No Drinking caffeine. No Drinking alcohol. No Eating. No Smoking. No Exercising.   Five minutes before checking your blood pressure: Pee. Sit in a dining chair. Avoid sitting in a soft couch or armchair. Be quiet. Do not talk

## 2023-02-27 ENCOUNTER — Ambulatory Visit: Payer: Medicare Other | Attending: Physician Assistant | Admitting: Physician Assistant

## 2023-02-27 ENCOUNTER — Encounter: Payer: Self-pay | Admitting: Physician Assistant

## 2023-02-27 VITALS — BP 112/76 | HR 68 | Ht 64.0 in | Wt 118.6 lb

## 2023-02-27 DIAGNOSIS — I10 Essential (primary) hypertension: Secondary | ICD-10-CM

## 2023-02-27 DIAGNOSIS — E785 Hyperlipidemia, unspecified: Secondary | ICD-10-CM

## 2023-02-27 DIAGNOSIS — I471 Supraventricular tachycardia, unspecified: Secondary | ICD-10-CM | POA: Diagnosis not present

## 2023-02-27 DIAGNOSIS — Z79899 Other long term (current) drug therapy: Secondary | ICD-10-CM | POA: Diagnosis not present

## 2023-02-27 MED ORDER — VALSARTAN 40 MG PO TABS: 40.0000 mg | ORAL_TABLET | Freq: Every day | ORAL | 2 refills | Status: DC

## 2023-02-27 MED ORDER — SPIRONOLACTONE 25 MG PO TABS: 12.5000 mg | ORAL_TABLET | Freq: Every day | ORAL | 3 refills | Status: DC

## 2023-02-27 NOTE — Progress Notes (Signed)
Cardiology Office Note:  .   Date:  02/27/2023  ID:  [name], DOB May 30, 1935, MRN [id number] PCP: Carylon Perches, MD  [facility] Providers Cardiologist:  Donna Miss, MD {  History of Present Illness: .   [name] is a 88 y.o. female with a past medical history of hypertension and PSVT.  Seen originally for management of hypertension.  Variable BP and on 2 blood pressure medications.  Was last seen by Dr. Elease Hashimoto a month ago and at that time was having variable BP, eating salt regularly.  Some episodes of chest tightness/squeezing.  Usually occurred when stressed.  Lasts for about 30 minutes or so.  Typically she lies down at this time to rest and the tightness is sides.  Not related to exercise.  Some forgetfulness.  No signs or symptoms of stroke.  Was seen February 05, 2022 and at that time had 1 severe episode of SVT, lasted 10 minutes or so.  Woke up with fast heart rate.  Was having frequent episodes of dizziness specially in the morning.  Symptoms were consistent with orthostasis.  CAC of 490.  Seen April 17, 2022 and at that time was having palpitations on occasion.  Lots of back pain.  As result of the pain, blood pressure has been elevated.  Try to avoid salty foods for the most part.  Lisinopril was discontinued and she was started on valsartan.  Seen December 04, 2022 and at that time was not feeling well.  Had some forgetfulness and lots of dizziness.  Balance issues.  Heart rate was 67 which is low for her age.  On metoprolol succinate 12.5 mg daily, spironolactone 25 mg daily, and Diovan 160 mg daily.  Having less fatigue and dizziness.  Metoprolol was discontinued at that time.  Close follow-up was arranged.  She was seen by me 7/29, she tells me that she is a very active person, she gets more dizzy in the morning or when she is getting up from her nap. She does try to stay hydrated. She has a low normal BP today, HR is much better off metoprolol, will  continue to hold. She still has some fatigue.  Today, she tells me that she has been having some dizziness that is worse in the morning and worse when she lays down.  She also notices it when she gets out of the chair quickly.  I do think this is probably some orthostatic hypotension.  She also tells me that she probably has not been drinking enough water.  We have decided to further decrease her medications and cut her Diovan and spironolactone in half today.  Asked her to monitor blood pressure closely.  Reports no shortness of breath nor dyspnea on exertion. Reports no chest pain, pressure, or tightness. No edema, orthopnea, PND. Reports no palpitations.    ROS: Pertinent ROS located in HPI  Studies Reviewed: .       Cardiac monitor 02/05/22   Underlying rhythm is normal sinus rhythm   Rare PVC   Occasional bradycardia ( in early morning , likely while sleeping )   No serious arrhythmias observed      Physical Exam:   VS:  BP 112/76   Pulse 68   Ht 5\' 4"  (1.626 m)   Wt 118 lb 9.6 oz (53.8 kg)   SpO2 98%   BMI 20.36 kg/m    Wt Readings from Last 3 Encounters:  02/27/23 118 lb 9.6 oz (53.8 kg)  01/20/23 116 lb (52.6 kg)  05/31/22 114 lb (51.7 kg)    GEN: Well nourished, well developed in no acute distress NECK: No JVD; No carotid bruits CARDIAC: RRR, no murmurs, rubs, gallops RESPIRATORY:  Clear to auscultation without rales, wheezing or rhonchi  ABDOMEN: Soft, non-tender, non-distended EXTREMITIES:  No edema; No deformity   ASSESSMENT AND PLAN: .   1.  PSVT -well controlled on current medications, HR slightly higher now off metoprolol  2.  Hyperlipidemia -continue crestor 2.5mg  daily -LDL 91, no CAD, at goal  3.  Hypertension/orthostatic hypotension -will decrease diovan to 40mg  daily and decrease spirolactone to 12.5mg  -continue proper hydration, 64 ounces -continue other medications   Dispo: She can follow-up in 8 weeks for BP check  Signed, Sharlene Dory,  PA-C

## 2023-02-27 NOTE — Patient Instructions (Signed)
Medication Instructions:   START TAKING: SPRINOLACTONE 12.5 MG ONCE A DAY   START TAKING:  VALSARTAN 40 MG ONCE A DAY   *If you need a refill on your cardiac medications before your next appointment, please call your pharmacy*   Lab Work: NONE ORDERED  TODAY   If you have labs (blood work) drawn today and your tests are completely normal, you will receive your results only by: MyChart Message (if you have MyChart) OR A paper copy in the mail If you have any lab test that is abnormal or we need to change your treatment, we will call you to review the results.   Testing/Procedures: NONE ORDERED  TODAY    Follow-Up: At [facility], you and your health needs are our priority.  As part of our continuing mission to provide you with exceptional heart care, we have created designated Provider Care Teams.  These Care Teams include your primary Cardiologist (physician) and Advanced Practice Providers (APPs -  Physician Assistants and Nurse Practitioners) who all work together to provide you with the care you need, when you need it.  We recommend signing up for the patient portal called "MyChart".  Sign up information is provided on this After Visit Summary.  MyChart is used to connect with patients for Virtual Visits (Telemedicine).  Patients are able to view lab/test results, encounter notes, upcoming appointments, etc.  Non-urgent messages can be sent to your provider as well.   To learn more about what you can do with MyChart, go to [email or web address].    Your next appointment:   2 month(s)  Provider:   Kristeen Miss, MD  or Jari Favre, PA-C      Other Instructions  Heart-Healthy Eating Plan Eating a healthy diet is important for the health of your heart. A heart-healthy eating plan includes: Eating less unhealthy fats. Eating more healthy fats. Eating less salt in your food. Salt is also called sodium. Making other changes in your diet. Talk with your doctor or a  diet specialist (dietitian) to create an eating plan that is right for you. What is my plan? Your doctor may recommend an eating plan that includes: Total fat: ______% or less of total calories a day. Saturated fat: ______% or less of total calories a day. Cholesterol: less than _________mg a day. Sodium: less than _________mg a day. What are tips for following this plan? Cooking Avoid frying your food. Try to bake, boil, grill, or broil it instead. You can also reduce fat by: Removing the skin from poultry. Removing all visible fats from meats. Steaming vegetables in water or broth. Meal planning  At meals, divide your plate into four equal parts: Fill one-half of your plate with vegetables and green salads. Fill one-fourth of your plate with whole grains. Fill one-fourth of your plate with lean protein foods. Eat 2-4 cups of vegetables per day. One cup of vegetables is: 1 cup (91 g) broccoli or cauliflower florets. 2 medium carrots. 1 large bell pepper. 1 large sweet potato. 1 large tomato. 1 medium white potato. 2 cups (150 g) raw leafy greens. Eat 1-2 cups of fruit per day. One cup of fruit is: 1 small apple 1 large banana 1 cup (237 g) mixed fruit, 1 large orange,  cup (82 g) dried fruit, 1 cup (240 mL) 100% fruit juice. Eat more foods that have soluble fiber. These are apples, broccoli, carrots, beans, peas, and barley. Try to get 20-30 g of fiber per day. Eat 4-5 servings  of nuts, legumes, and seeds per week: 1 serving of dried beans or legumes equals  cup (90 g) cooked. 1 serving of nuts is  oz (12 almonds, 24 pistachios, or 7 walnut halves). 1 serving of seeds equals  oz (8 g). General information Eat more home-cooked food. Eat less restaurant, buffet, and fast food. Limit or avoid alcohol. Limit foods that are high in starch and sugar. Avoid fried foods. Lose weight if you are overweight. Keep track of how much salt (sodium) you eat. This is important if  you have high blood pressure. Ask your doctor to tell you more about this. Try to add vegetarian meals each week. Fats Choose healthy fats. These include olive oil and canola oil, flaxseeds, walnuts, almonds, and seeds. Eat more omega-3 fats. These include salmon, mackerel, sardines, tuna, flaxseed oil, and ground flaxseeds. Try to eat fish at least 2 times each week. Check food labels. Avoid foods with trans fats or high amounts of saturated fat. Limit saturated fats. These are often found in animal products, such as meats, butter, and cream. These are also found in plant foods, such as palm oil, palm kernel oil, and coconut oil. Avoid foods with partially hydrogenated oils in them. These have trans fats. Examples are stick margarine, some tub margarines, cookies, crackers, and other baked goods. What foods should I eat? Fruits All fresh, canned (in natural juice), or frozen fruits. Vegetables Fresh or frozen vegetables (raw, steamed, roasted, or grilled). Green salads. Grains Most grains. Choose whole wheat and whole grains most of the time. Rice and pasta, including brown rice and pastas made with whole wheat. Meats and other proteins Lean, well-trimmed beef, veal, pork, and lamb. Chicken and [place] without skin. All fish and shellfish. Wild duck, rabbit, pheasant, and venison. Egg whites or low-cholesterol egg substitutes. Dried beans, peas, lentils, and tofu. Seeds and most nuts. Dairy Low-fat or nonfat cheeses, including ricotta and mozzarella. Skim or 1% milk that is liquid, powdered, or evaporated. Buttermilk that is made with low-fat milk. Nonfat or low-fat yogurt. Fats and oils Non-hydrogenated (trans-free) margarines. Vegetable oils, including soybean, sesame, sunflower, olive, peanut, safflower, corn, canola, and cottonseed. Salad dressings or mayonnaise made with a vegetable oil. Beverages Mineral water. Coffee and tea. Diet carbonated beverages. Sweets and desserts Sherbet,  gelatin, and fruit ice. Small amounts of dark chocolate. Limit all sweets and desserts. Seasonings and condiments All seasonings and condiments. The items listed above may not be a complete list of foods and drinks you can eat. Contact a dietitian for more options. What foods should I avoid? Fruits Canned fruit in heavy syrup. Fruit in cream or butter sauce. Fried fruit. Limit coconut. Vegetables Vegetables cooked in cheese, cream, or butter sauce. Fried vegetables. Grains Breads that are made with saturated or trans fats, oils, or whole milk. Croissants. Sweet rolls. Donuts. High-fat crackers, such as cheese crackers. Meats and other proteins Fatty meats, such as hot dogs, ribs, sausage, bacon, rib-eye roast or steak. High-fat deli meats, such as salami and bologna. Caviar. Domestic duck and goose. Organ meats, such as liver. Dairy Cream, sour cream, cream cheese, and creamed cottage cheese. Whole-milk cheeses. Whole or 2% milk that is liquid, evaporated, or condensed. Whole buttermilk. Cream sauce or high-fat cheese sauce. Yogurt that is made from whole milk. Fats and oils Meat fat, or shortening. Cocoa butter, hydrogenated oils, palm oil, coconut oil, palm kernel oil. Solid fats and shortenings, including bacon fat, salt pork, lard, and butter. Nondairy cream substitutes. Salad dressings  with cheese or sour cream. Beverages Regular sodas and juice drinks with added sugar. Sweets and desserts Frosting. Pudding. Cookies. Cakes. Pies. Milk chocolate or white chocolate. Buttered syrups. Full-fat ice cream or ice cream drinks. The items listed above may not be a complete list of foods and drinks to avoid. Contact a dietitian for more information. Summary Heart-healthy meal planning includes eating less unhealthy fats, eating more healthy fats, and making other changes in your diet. Eat a balanced diet. This includes fruits and vegetables, low-fat or nonfat dairy, lean protein, nuts and  legumes, whole grains, and heart-healthy oils and fats. This information is not intended to replace advice given to you by your health care provider. Make sure you discuss any questions you have with your health care provider. Document Revised: 07/16/2021 Document Reviewed: 07/16/2021 Elsevier Patient Education  2024 [employer].

## 2023-04-28 ENCOUNTER — Encounter: Payer: Self-pay | Admitting: Cardiovascular Disease

## 2023-04-28 NOTE — Progress Notes (Unsigned)
Cardiology Office Note:    Date:  04/29/2023   ID:  [name], DOB July 05, 1934, MRN [id number]  PCP:  Carylon Perches, MD  [facility] Cardiologist:  Luvenia Heller  [facility] Electrophysiologist:  None   Referring MD: Carylon Perches, MD   Chief Complaint  Patient presents with   Hypertension        Tachycardia    Oct. 27, 2021   [name] is a 88 y.o. female with a hx of HTN, and PSVT .  We were asked to see her by Dr. Ouida Sills for further evaluation and management of her HTN. Seen with daughter, Clydie Braun   She has episodes of variable BP .  She is on 2 BP meds.   Eats salt regularly   Some episodes of chest tightness, squeezing  Usually occurs when she is stressed . Lasts for 30 minutes or so . Typically she lies down to rest and the tightness improves.  Not related to exercise   Has some forgetfulness  No signs or symptoms of stroke   Aug. 15, 2023 [name] is Seen with [place] in [place],  Floreen Comber  is seen for follow up of her HTN, PSVT  Had 1 severe episode of SVT .   Lasted 10 minutes or so  Woke up with this fast HR   Family takes breakfast over every day,  she relates episodes to the family   Has frequent episodes of dizziness, especially in the morming  Symptoms are c/w orthostasis   Has a CAC of 490 .   Oct. 25, 2023 [name] is seen for follow up of her HTN, SVT Was having significant SVT when I saw her in Aug  Has palpitations on occasion , She has not mentioned it to her daughter recently  Lots of back pain  As a result, her BP is elevated.   Avoids salty foods for the most part   Will DC Lisinopril 20 mg a day and start valsartan 160 mg a day    December 04, 2022 [name] is seen for follow up visit for her HTN, SVT Seen with daughter, [name].   She has not been feeling well Has some forgetfulness Has lots of dizzyness Balance issues at time almost always when standing up.  Her heart rate is 67 which may be a little bit low for  her at the age of 78.  She is on Toprol-XL 12.5 mg a day, spironolactone 25 mg a day, Diovan 160 mg a day   She is having fatigue , dizziness Will DC the Toprol XL to see if that helps  Will have her return in 4 to 6 weeks to see an APP.  If her symptoms have resolved and will continue as she is.  If she continues to have lightheadedness especially with standing then we will continue to taper down her valsartan and perhaps spironolactone.   Nov. 5, 2024   Seen with [name]  - daughter .    [name] is seen for follow up of her HTN, SVT  BP has been normal at home .  S BP of 120 - 130  A little elevated here  She was having dizziness. Her BP meds were reduced and the dizziness has resolved.       Past Medical History:  Diagnosis Date   Basal cell carcinoma    Chest pain    Diverticulosis    DJD (degenerative joint disease) of cervical spine    GERD (gastroesophageal  reflux disease)    Head injury, closed    Headache(784.0)    Osteopenia    Pneumothorax 2006   PSVT (paroxysmal supraventricular tachycardia) (HCC) 2003    Past Surgical History:  Procedure Laterality Date   ABDOMINAL HYSTERECTOMY     APPENDECTOMY     COLONOSCOPY     NISSEN FUNDOPLICATION     TONSILLECTOMY      Current Medications: Current Meds  Medication Sig   aspirin EC 81 MG tablet Take 81 mg by mouth daily. Swallow whole.   Cholecalciferol (VITAMIN D3) 125 MCG (5000 UT) CAPS Take by mouth.   gabapentin (NEURONTIN) 100 MG capsule Take 100 mg by mouth as needed.   naproxen sodium (ANAPROX) 220 MG tablet Take 220 mg by mouth daily as needed. For pain   valsartan (DIOVAN) 40 MG tablet Take 1 tablet (40 mg total) by mouth daily.   [DISCONTINUED] spironolactone (ALDACTONE) 25 MG tablet Take 0.5 tablets (12.5 mg total) by mouth daily.     Allergies:   Glucose, Morphine, Sulfonamide derivatives, Vancomycin, Wheat, and Penicillins   Social History   Socioeconomic History   Marital status: Married     Spouse name: Myles   Number of children: 4   Years of education: College   Highest education level: Not on file  Occupational History   Occupation: [employer]    Employer: RETIRED  Tobacco Use   Smoking status: Never   Smokeless tobacco: Never  Substance and Sexual Activity   Alcohol use: No   Drug use: No   Sexual activity: Not on file  Other Topics Concern   Not on file  Social History Narrative   No regular exercise   Patient lives at home with spouse.   Caffeine Use:   Social Determinants of Health   Financial Resource Strain: Not on file  Food Insecurity: Not on file  Transportation Needs: Not on file  Physical Activity: Not on file  Stress: Not on file  Social Connections: Not on file     Family History: The patient's family history includes Arthritis in her mother and sister; Asthma in her sister; Heart disease in her father and sister; Kidney failure in her brother; Parkinsonism in her father; Vasculitis in her mother.  ROS:   Please see the history of present illness.     All other systems reviewed and are negative.  EKGs/Labs/Other Studies Reviewed:    The following studies were reviewed today:     Recent Labs: 05/06/2022: ALT 12 12/31/2022: BUN 17; Creatinine, Ser 0.84; Potassium 4.7; Sodium 135  Recent Lipid Panel    Component Value Date/Time   CHOL 173 05/06/2022 0940   TRIG 78 05/06/2022 0940   HDL 67 05/06/2022 0940   CHOLHDL 2.6 05/06/2022 0940   CHOLHDL 2.1 12/08/2020 0948   VLDL 16 12/08/2020 0948   LDLCALC 91 05/06/2022 0940     Risk Assessment/Calculations:       Physical Exam:     Physical Exam: Blood pressure (!) 150/70, pulse 68, height 4\' 11"  (1.499 m), weight 114 lb (51.7 kg), SpO2 92%.  HYPERTENSION CONTROL Vitals:   04/29/23 1337 04/29/23 1359  BP: (!) 148/72 (!) 150/70    The patient's blood pressure is elevated above target today.  In order to address the patient's elevated BP: Blood pressure will be monitored at  home to determine if medication changes need to be made.       GEN:  Well nourished, well developed in no acute distress HEENT:  Normal NECK: No JVD; No carotid bruits LYMPHATICS: No lymphadenopathy CARDIAC: RRR , no murmurs, rubs, gallops RESPIRATORY:  Clear to auscultation without rales, wheezing or rhonchi  ABDOMEN: Soft, non-tender, non-distended MUSCULOSKELETAL:  No edema; No deformity  SKIN: Warm and dry NEUROLOGIC:  Alert and oriented x 3     EKG:         ASSESSMENT:    1. Primary hypertension   2. Orthostatic hypotension       PLAN:      HTN:  BP is well controlled at home .   Is a bit higher here.  Her daughter is concerned that she is still having lots of orthostatic symptoms .    We may have to allow her BP to run a bit on the high Powers in order to prevent her from falling .   Will DC spironolactone,  cont valsartan   Will have her see Benjamine Sprague, NP in 3 months   2.  SVT :   stable       2.  Hyperlipidemia: stable    3.  Orthostatic hypotension:  .       Medication Adjustments/Labs and Tests Ordered: Current medicines are reviewed at length with the patient today.  Concerns regarding medicines are outlined above.  No orders of the defined types were placed in this encounter.  No orders of the defined types were placed in this encounter.     Patient Instructions  Medication Instructions:  STOP Spironolactone *If you need a refill on your cardiac medications before your next appointment, please call your pharmacy*  Follow-Up: At [facility], you and your health needs are our priority.  As part of our continuing mission to provide you with exceptional heart care, we have created designated Provider Care Teams.  These Care Teams include your primary Cardiologist (physician) and Advanced Practice Providers (APPs -  Physician Assistants and Nurse Practitioners) who all work together to provide you with the care you need, when you  need it.   Your next appointment:   3 month(s)  Provider:   Eligha Bridegroom, NP       Signed, Kristeen Miss, MD  04/29/2023 5:43 PM     [facility]

## 2023-04-29 ENCOUNTER — Ambulatory Visit: Payer: Medicare Other | Attending: Cardiovascular Disease | Admitting: Cardiovascular Disease

## 2023-04-29 VITALS — BP 150/70 | HR 68 | Ht 59.0 in | Wt 114.0 lb

## 2023-04-29 DIAGNOSIS — I951 Orthostatic hypotension: Secondary | ICD-10-CM

## 2023-04-29 DIAGNOSIS — I10 Essential (primary) hypertension: Secondary | ICD-10-CM | POA: Diagnosis not present

## 2023-04-29 NOTE — Patient Instructions (Signed)
Medication Instructions:  STOP Spironolactone *If you need a refill on your cardiac medications before your next appointment, please call your pharmacy*  Follow-Up: At [facility], you and your health needs are our priority.  As part of our continuing mission to provide you with exceptional heart care, we have created designated Provider Care Teams.  These Care Teams include your primary Cardiologist (physician) and Advanced Practice Providers (APPs -  Physician Assistants and Nurse Practitioners) who all work together to provide you with the care you need, when you need it.   Your next appointment:   3 month(s)  Provider:   Eligha Bridegroom, NP

## 2023-07-31 ENCOUNTER — Ambulatory Visit: Payer: Medicare Other | Admitting: Nurse Practitioner

## 2023-09-11 ENCOUNTER — Encounter: Payer: Self-pay | Admitting: Cardiovascular Disease

## 2023-09-11 DIAGNOSIS — E785 Hyperlipidemia, unspecified: Secondary | ICD-10-CM | POA: Diagnosis not present

## 2023-09-11 DIAGNOSIS — K219 Gastro-esophageal reflux disease without esophagitis: Secondary | ICD-10-CM | POA: Diagnosis not present

## 2023-09-11 DIAGNOSIS — Z79899 Other long term (current) drug therapy: Secondary | ICD-10-CM | POA: Diagnosis not present

## 2023-09-11 DIAGNOSIS — I10 Essential (primary) hypertension: Secondary | ICD-10-CM | POA: Diagnosis not present

## 2023-09-11 NOTE — Progress Notes (Unsigned)
 Cardiology Office Note:    Date:  09/12/2023   ID:  [name], DOB 07/11/1934, MRN [id number]  PCP:  Carylon Perches, MD  [facility] Cardiologist:  Luvenia Heller  [facility] Electrophysiologist:  None   Referring MD: Carylon Perches, MD   Chief Complaint  Patient presents with   Hypertension        Hyperlipidemia    Oct. 27, 2021   [name] is a 89 y.o. female with a hx of HTN, and PSVT .  We were asked to see her by Dr. Ouida Sills for further evaluation and management of her HTN. Seen with daughter, [name]   She has episodes of variable BP .  She is on 2 BP meds.   Eats salt regularly   Some episodes of chest tightness, squeezing  Usually occurs when she is stressed . Lasts for 30 minutes or so . Typically she lies down to rest and the tightness improves.  Not related to exercise   Has some forgetfulness  No signs or symptoms of stroke   Aug. 15, 2023 [name] is Seen with son in [name],  Floreen Powers  is seen for follow up of her HTN, PSVT  Had 1 severe episode of SVT .   Lasted 10 minutes or so  Woke up with this fast HR   Family takes breakfast over every day,  she relates episodes to the family   Has frequent episodes of dizziness, especially in the morming  Symptoms are c/w orthostasis   Has a CAC of 490 .   Oct. 25, 2023 [name] is seen for follow up of her HTN, SVT Was having significant SVT when I saw her in Aug  Has palpitations on occasion , She has not mentioned it to her daughter recently  Lots of back pain  As a result, her BP is elevated.   Avoids salty foods for the most part   Will DC Lisinopril 20 mg a day and start valsartan 160 mg a day    December 04, 2022 [name] is seen for follow up visit for her HTN, SVT Seen with daughter, [name].   She has not been feeling well Has some forgetfulness Has lots of dizzyness Balance issues at time almost always when standing up.  Her heart rate is 67 which may be a little bit low  for her at the age of 41.  She is on Toprol-XL 12.5 mg a day, spironolactone 25 mg a day, Diovan 160 mg a day   She is having fatigue , dizziness Will DC the Toprol XL to see if that helps  Will have her return in 4 to 6 weeks to see an APP.  If her symptoms have resolved and will continue as she is.  If she continues to have lightheadedness especially with standing then we will continue to taper down her valsartan and perhaps spironolactone.   Nov. 5, 2024   Seen with [name]  - daughter .    [name] is seen for follow up of her HTN, SVT  BP has been normal at home .  S BP of 120 - 130  A little elevated here  She was having dizziness. Her BP meds were reduced and the dizziness has resolved.   September 12, 2023 [name] is seen for follow up of her SVT, HTN      Past Medical History:  Diagnosis Date   Basal cell carcinoma    Chest pain    Diverticulosis  DJD (degenerative joint disease) of cervical spine    GERD (gastroesophageal reflux disease)    Head injury, closed    Headache(784.0)    Osteopenia    Pneumothorax 2006   PSVT (paroxysmal supraventricular tachycardia) (HCC) 2003    Past Surgical History:  Procedure Laterality Date   ABDOMINAL HYSTERECTOMY     APPENDECTOMY     COLONOSCOPY     NISSEN FUNDOPLICATION     TONSILLECTOMY      Current Medications: Current Meds  Medication Sig   aspirin EC 81 MG tablet Take 81 mg by mouth daily. Swallow whole.   Cholecalciferol (VITAMIN D3) 125 MCG (5000 UT) CAPS Take by mouth.   gabapentin (NEURONTIN) 100 MG capsule Take 100 mg by mouth as needed.   naproxen sodium (ANAPROX) 220 MG tablet Take 220 mg by mouth daily as needed. For pain   valsartan (DIOVAN) 80 MG tablet Take 1 tablet (80 mg total) by mouth daily.   [DISCONTINUED] valsartan (DIOVAN) 40 MG tablet Take 1 tablet (40 mg total) by mouth daily.     Allergies:   Glucose, Morphine, Sulfonamide derivatives, Vancomycin, Wheat, and Penicillins   Social History    Socioeconomic History   Marital status: Married    Spouse name: Myles   Number of children: 4   Years of education: College   Highest education level: Not on file  Occupational History   Occupation: August    Employer: RETIRED  Tobacco Use   Smoking status: Never   Smokeless tobacco: Never  Substance and Sexual Activity   Alcohol use: No   Drug use: No   Sexual activity: Not on file  Other Topics Concern   Not on file  Social History Narrative   No regular exercise   Patient lives at home with spouse.   Caffeine Use:   Social Drivers of Corporate investment banker Strain: Not on file  Food Insecurity: Not on file  Transportation Needs: Not on file  Physical Activity: Not on file  Stress: Not on file  Social Connections: Not on file     Family History: The patient's family history includes Arthritis in her mother and sister; Asthma in her sister; Heart disease in her father and sister; Kidney failure in her brother; Parkinsonism in her father; Vasculitis in her mother.  ROS:   Please see the history of present illness.     All other systems reviewed and are negative.  EKGs/Labs/Other Studies Reviewed:    The following studies were reviewed today:     Recent Labs: 12/31/2022: BUN 17; Creatinine, Ser 0.84; Potassium 4.7; Sodium 135  Recent Lipid Panel    Component Value Date/Time   CHOL 173 05/06/2022 0940   TRIG 78 05/06/2022 0940   HDL 67 05/06/2022 0940   CHOLHDL 2.6 05/06/2022 0940   CHOLHDL 2.1 12/08/2020 0948   VLDL 16 12/08/2020 0948   LDLCALC 91 05/06/2022 0940     Risk Assessment/Calculations:       Physical Exam:      Physical Exam: Blood pressure (!) 150/70, pulse 90, height 4\' 11"  (1.499 m), weight 113 lb (51.3 kg), SpO2 97%.  HYPERTENSION CONTROL Vitals:   09/12/23 1330 09/12/23 1350  BP: (!) 154/72 (!) 150/70    The patient's blood pressure is elevated above target today.  In order to address the patient's elevated BP: A  current anti-hypertensive medication was adjusted today.       GEN:  Well nourished, well developed in no acute distress  HEENT: Normal NECK: No JVD; No carotid bruits LYMPHATICS: No lymphadenopathy CARDIAC: RRR , no murmurs, rubs, gallops RESPIRATORY:  Clear to auscultation without rales, wheezing or rhonchi  ABDOMEN: Soft, non-tender, non-distended MUSCULOSKELETAL:  No edema; No deformity  SKIN: Warm and dry NEUROLOGIC:  Alert and oriented x 3     EKG:         ASSESSMENT:    1. Primary hypertension   2. Orthostatic hypotension        PLAN:      HTN:   BP is a bit elevated.   She has been under more stress recently with the passing of her husband  .  May be eating a bit more salt than she should.  Will have her restrict her salt slightly.  Will increase the valsartan from 40 mg up to 80 mg a day.  She would like to be seen in the [place] office since she lives up in [place].  Will set her up to see Randall An, PA in several months.   Will check a basic metabolic profile at that time.  2.  SVT :    No recent episodes      2.  Hyperlipidemia: stable    3.  Orthostatic hypotension:  .       Medication Adjustments/Labs and Tests Ordered: Current medicines are reviewed at length with the patient today.  Concerns regarding medicines are outlined above.  Orders Placed This Encounter  Procedures   Basic metabolic panel   Meds ordered this encounter  Medications   valsartan (DIOVAN) 80 MG tablet    Sig: Take 1 tablet (80 mg total) by mouth daily.    Dispense:  90 tablet    Refill:  3    Dose INCREASE      Patient Instructions  Medication Instructions:  INCREASE Valsartan/Diova to 80mg  daily *If you need a refill on your cardiac medications before your next appointment, please call your pharmacy*   Lab Work: BMET at next appt If you have labs (blood work) drawn today and your tests are completely normal, you will receive your results  only by: MyChart Message (if you have MyChart) OR A paper copy in the mail If you have any lab test that is abnormal or we need to change your treatment, we will call you to review the results.  Follow-Up: At [facility], you and your health needs are our priority.  As part of our continuing mission to provide you with exceptional heart care, we have created designated Provider Care Teams.  These Care Teams include your primary Cardiologist (physician) and Advanced Practice Providers (APPs -  Physician Assistants and Nurse Practitioners) who all work together to provide you with the care you need, when you need it.  Your next appointment:   4 month(s)  Provider:   Randall An, PA     1st Floor: - Lobby - Registration  - Pharmacy  - Lab - Cafe  2nd Floor: - PV Lab - Diagnostic Testing (echo, CT, nuclear med)  3rd Floor: - Vacant  4th Floor: - TCTS (cardiothoracic surgery) - AFib Clinic - Structural Heart Clinic - Vascular Surgery  - Vascular Ultrasound  5th Floor: - HeartCare Cardiology (general and EP) - Clinical Pharmacy for coumadin, hypertension, lipid, weight-loss medications, and med management appointments    Valet parking services will be available as well.     Signed, Kristeen Miss, MD  09/12/2023 2:48 PM     [facility]

## 2023-09-12 ENCOUNTER — Ambulatory Visit: Payer: Medicare Other | Attending: Cardiovascular Disease | Admitting: Cardiovascular Disease

## 2023-09-12 ENCOUNTER — Encounter: Payer: Self-pay | Admitting: Cardiovascular Disease

## 2023-09-12 VITALS — BP 150/70 | HR 90 | Ht 59.0 in | Wt 113.0 lb

## 2023-09-12 DIAGNOSIS — I10 Essential (primary) hypertension: Secondary | ICD-10-CM

## 2023-09-12 DIAGNOSIS — I951 Orthostatic hypotension: Secondary | ICD-10-CM

## 2023-09-12 MED ORDER — VALSARTAN 80 MG PO TABS: 80.0000 mg | ORAL_TABLET | Freq: Every day | ORAL | 3 refills | Status: DC

## 2023-09-12 NOTE — Patient Instructions (Signed)
 Medication Instructions:  INCREASE Valsartan/Diova to 80mg  daily *If you need a refill on your cardiac medications before your next appointment, please call your pharmacy*   Lab Work: BMET at next appt If you have labs (blood work) drawn today and your tests are completely normal, you will receive your results only by: MyChart Message (if you have MyChart) OR A paper copy in the mail If you have any lab test that is abnormal or we need to change your treatment, we will call you to review the results.  Follow-Up: At [facility], you and your health needs are our priority.  As part of our continuing mission to provide you with exceptional heart care, we have created designated Provider Care Teams.  These Care Teams include your primary Cardiologist (physician) and Advanced Practice Providers (APPs -  Physician Assistants and Nurse Practitioners) who all work together to provide you with the care you need, when you need it.  Your next appointment:   4 month(s)  Provider:   Randall An, PA     1st Floor: - Lobby - Registration  - Pharmacy  - Lab - Cafe  2nd Floor: - PV Lab - Diagnostic Testing (echo, CT, nuclear med)  3rd Floor: - Vacant  4th Floor: - TCTS (cardiothoracic surgery) - AFib Clinic - Structural Heart Clinic - Vascular Surgery  - Vascular Ultrasound  5th Floor: - HeartCare Cardiology (general and EP) - Clinical Pharmacy for coumadin, hypertension, lipid, weight-loss medications, and med management appointments    Valet parking services will be available as well.

## 2023-09-18 ENCOUNTER — Other Ambulatory Visit (HOSPITAL_COMMUNITY): Payer: Self-pay | Admitting: Internal Medicine

## 2023-09-18 DIAGNOSIS — Z0001 Encounter for general adult medical examination with abnormal findings: Secondary | ICD-10-CM | POA: Diagnosis not present

## 2023-09-18 DIAGNOSIS — E785 Hyperlipidemia, unspecified: Secondary | ICD-10-CM | POA: Diagnosis not present

## 2023-09-18 DIAGNOSIS — G3184 Mild cognitive impairment, so stated: Secondary | ICD-10-CM | POA: Diagnosis not present

## 2023-09-18 DIAGNOSIS — Z8673 Personal history of transient ischemic attack (TIA), and cerebral infarction without residual deficits: Secondary | ICD-10-CM | POA: Diagnosis not present

## 2023-09-18 DIAGNOSIS — I10 Essential (primary) hypertension: Secondary | ICD-10-CM | POA: Diagnosis not present

## 2023-09-18 DIAGNOSIS — M810 Age-related osteoporosis without current pathological fracture: Secondary | ICD-10-CM

## 2023-09-18 DIAGNOSIS — K2100 Gastro-esophageal reflux disease with esophagitis, without bleeding: Secondary | ICD-10-CM | POA: Diagnosis not present

## 2023-09-18 DIAGNOSIS — I700 Atherosclerosis of aorta: Secondary | ICD-10-CM | POA: Diagnosis not present

## 2023-09-18 DIAGNOSIS — I471 Supraventricular tachycardia, unspecified: Secondary | ICD-10-CM | POA: Diagnosis not present

## 2023-09-26 ENCOUNTER — Ambulatory Visit (HOSPITAL_COMMUNITY)
Admission: RE | Admit: 2023-09-26 | Discharge: 2023-09-26 | Disposition: A | Discharge status: Home / Self Care | Source: Ambulatory Visit | Attending: Internal Medicine | Admitting: Internal Medicine

## 2023-09-26 DIAGNOSIS — M810 Age-related osteoporosis without current pathological fracture: Secondary | ICD-10-CM | POA: Diagnosis not present

## 2023-10-13 ENCOUNTER — Telehealth: Payer: Self-pay | Admitting: Cardiovascular Disease

## 2023-10-13 NOTE — Telephone Encounter (Signed)
 Spoke with daughter per DPR and stated that since the increase in her valsartan  she have seen an increase in her confusion and fatigue. She states with the increase in her confusion they would like to decrease the dose the dose to 40 mg. She would like to know your thoughts

## 2023-10-13 NOTE — Telephone Encounter (Signed)
 Daughter Lewis Red) returned RN's call.

## 2023-10-13 NOTE — Telephone Encounter (Signed)
 Left voicemail to return call to office

## 2023-10-13 NOTE — Telephone Encounter (Signed)
 Pt c/o medication issue:  1. Name of Medication:   valsartan  (DIOVAN ) 80 MG tablet    2. How are you currently taking this medication (dosage and times per day)? As written   3. Are you having a reaction (difficulty breathing--STAT)? no  4. What is your medication issue? Since medication has been upped to 80 mg she is fatigued, confusion

## 2023-10-16 NOTE — Telephone Encounter (Signed)
  The valsartan  should not be causing any confusion / memory issues .   She may reduce the dose if she would like  PN   Returned call to daughter and explained Dr Letta Raw comments. She will cute to Valsartan

## 2023-12-09 ENCOUNTER — Emergency Department (HOSPITAL_COMMUNITY)

## 2023-12-09 ENCOUNTER — Other Ambulatory Visit: Payer: Self-pay

## 2023-12-09 ENCOUNTER — Observation Stay (HOSPITAL_COMMUNITY)
Admission: EM | Admit: 2023-12-09 | Discharge: 2023-12-10 | Disposition: A | Discharge status: Home Health | Attending: Internal Medicine | Admitting: Internal Medicine

## 2023-12-09 ENCOUNTER — Encounter (HOSPITAL_COMMUNITY): Payer: Self-pay

## 2023-12-09 DIAGNOSIS — S0993XA Unspecified injury of face, initial encounter: Secondary | ICD-10-CM | POA: Diagnosis not present

## 2023-12-09 DIAGNOSIS — Z7982 Long term (current) use of aspirin: Secondary | ICD-10-CM | POA: Insufficient documentation

## 2023-12-09 DIAGNOSIS — S199XXA Unspecified injury of neck, initial encounter: Secondary | ICD-10-CM | POA: Diagnosis not present

## 2023-12-09 DIAGNOSIS — R110 Nausea: Secondary | ICD-10-CM | POA: Diagnosis not present

## 2023-12-09 DIAGNOSIS — M47812 Spondylosis without myelopathy or radiculopathy, cervical region: Secondary | ICD-10-CM | POA: Diagnosis not present

## 2023-12-09 DIAGNOSIS — Z85828 Personal history of other malignant neoplasm of skin: Secondary | ICD-10-CM | POA: Insufficient documentation

## 2023-12-09 DIAGNOSIS — I669 Occlusion and stenosis of unspecified cerebral artery: Secondary | ICD-10-CM | POA: Insufficient documentation

## 2023-12-09 DIAGNOSIS — R220 Localized swelling, mass and lump, head: Secondary | ICD-10-CM | POA: Diagnosis not present

## 2023-12-09 DIAGNOSIS — E041 Nontoxic single thyroid nodule: Secondary | ICD-10-CM | POA: Diagnosis not present

## 2023-12-09 DIAGNOSIS — I63521 Cerebral infarction due to unspecified occlusion or stenosis of right anterior cerebral artery: Secondary | ICD-10-CM | POA: Diagnosis not present

## 2023-12-09 DIAGNOSIS — S01112A Laceration without foreign body of left eyelid and periocular area, initial encounter: Secondary | ICD-10-CM | POA: Insufficient documentation

## 2023-12-09 DIAGNOSIS — I10 Essential (primary) hypertension: Secondary | ICD-10-CM | POA: Diagnosis not present

## 2023-12-09 DIAGNOSIS — Z23 Encounter for immunization: Secondary | ICD-10-CM | POA: Insufficient documentation

## 2023-12-09 DIAGNOSIS — I679 Cerebrovascular disease, unspecified: Secondary | ICD-10-CM

## 2023-12-09 DIAGNOSIS — Z043 Encounter for examination and observation following other accident: Secondary | ICD-10-CM | POA: Diagnosis not present

## 2023-12-09 DIAGNOSIS — Z79899 Other long term (current) drug therapy: Secondary | ICD-10-CM | POA: Diagnosis not present

## 2023-12-09 DIAGNOSIS — W19XXXA Unspecified fall, initial encounter: Secondary | ICD-10-CM | POA: Diagnosis not present

## 2023-12-09 DIAGNOSIS — M19011 Primary osteoarthritis, right shoulder: Secondary | ICD-10-CM | POA: Diagnosis not present

## 2023-12-09 DIAGNOSIS — R197 Diarrhea, unspecified: Secondary | ICD-10-CM | POA: Diagnosis not present

## 2023-12-09 DIAGNOSIS — R918 Other nonspecific abnormal finding of lung field: Secondary | ICD-10-CM | POA: Diagnosis not present

## 2023-12-09 DIAGNOSIS — Z8673 Personal history of transient ischemic attack (TIA), and cerebral infarction without residual deficits: Secondary | ICD-10-CM | POA: Diagnosis not present

## 2023-12-09 DIAGNOSIS — R519 Headache, unspecified: Secondary | ICD-10-CM | POA: Diagnosis not present

## 2023-12-09 DIAGNOSIS — J320 Chronic maxillary sinusitis: Secondary | ICD-10-CM | POA: Diagnosis not present

## 2023-12-09 DIAGNOSIS — R55 Syncope and collapse: Principal | ICD-10-CM | POA: Insufficient documentation

## 2023-12-09 DIAGNOSIS — I700 Atherosclerosis of aorta: Secondary | ICD-10-CM | POA: Diagnosis not present

## 2023-12-09 DIAGNOSIS — I6522 Occlusion and stenosis of left carotid artery: Secondary | ICD-10-CM | POA: Diagnosis not present

## 2023-12-09 DIAGNOSIS — R42 Dizziness and giddiness: Secondary | ICD-10-CM | POA: Diagnosis not present

## 2023-12-09 LAB — COMPREHENSIVE METABOLIC PANEL WITH GFR
ALT: 15 U/L (ref 0–44)
AST: 23 U/L (ref 15–41)
Albumin: 3.9 g/dL (ref 3.5–5.0)
Alkaline Phosphatase: 86 U/L (ref 38–126)
Anion gap: 10 (ref 5–15)
BUN: 21 mg/dL (ref 8–23)
CO2: 25 mmol/L (ref 22–32)
Calcium: 8.6 mg/dL — ABNORMAL LOW (ref 8.9–10.3)
Chloride: 99 mmol/L (ref 98–111)
Creatinine, Ser: 0.77 mg/dL (ref 0.44–1.00)
GFR, Estimated: >60 mL/min (ref >60)
Glucose, Bld: 108 mg/dL — ABNORMAL HIGH (ref 70–99)
Potassium: 4.1 mmol/L (ref 3.5–5.1)
Sodium: 134 mmol/L — ABNORMAL LOW (ref 135–145)
Total Bilirubin: 0.4 mg/dL (ref 0.0–1.2)
Total Protein: 6.8 g/dL (ref 6.5–8.1)

## 2023-12-09 LAB — CBC WITH DIFFERENTIAL/PLATELET
Abs Immature Granulocytes: 0.11 10*3/uL — ABNORMAL HIGH (ref 0.00–0.07)
Basophils Absolute: 0.1 10*3/uL (ref 0.0–0.1)
Basophils Relative: 1 %
Eosinophils Absolute: 0.1 10*3/uL (ref 0.0–0.5)
Eosinophils Relative: 1 %
HCT: 48.1 % — ABNORMAL HIGH (ref 36.0–46.0)
Hemoglobin: 16 g/dL — ABNORMAL HIGH (ref 12.0–15.0)
Immature Granulocytes: 1 %
Lymphocytes Relative: 4 %
Lymphs Abs: 0.7 10*3/uL (ref 0.7–4.0)
MCH: 31.4 pg (ref 26.0–34.0)
MCHC: 33.3 g/dL (ref 30.0–36.0)
MCV: 94.3 fL (ref 80.0–100.0)
Monocytes Absolute: 0.9 10*3/uL (ref 0.1–1.0)
Monocytes Relative: 5 %
Neutro Abs: 13.9 10*3/uL — ABNORMAL HIGH (ref 1.7–7.7)
Neutrophils Relative %: 88 %
Platelets: 281 10*3/uL (ref 150–400)
RBC: 5.1 MIL/uL (ref 3.87–5.11)
RDW: 13 % (ref 11.5–15.5)
WBC: 15.8 10*3/uL — ABNORMAL HIGH (ref 4.0–10.5)
nRBC: 0 % (ref 0.0–0.2)

## 2023-12-09 LAB — I-STAT CHEM 8, ED
BUN: 36 mg/dL — ABNORMAL HIGH (ref 8–23)
Calcium, Ion: 0.96 mmol/L — ABNORMAL LOW (ref 1.15–1.40)
Chloride: 106 mmol/L (ref 98–111)
Creatinine, Ser: 0.9 mg/dL (ref 0.44–1.00)
Glucose, Bld: 134 mg/dL — ABNORMAL HIGH (ref 70–99)
HCT: 50 % — ABNORMAL HIGH (ref 36.0–46.0)
Hemoglobin: 17 g/dL — ABNORMAL HIGH (ref 12.0–15.0)
Potassium: 7.4 mmol/L (ref 3.5–5.1)
Sodium: 134 mmol/L — ABNORMAL LOW (ref 135–145)
TCO2: 24 mmol/L (ref 22–32)

## 2023-12-09 LAB — TROPONIN I (HIGH SENSITIVITY)
Troponin I (High Sensitivity): 4 ng/L (ref <18)
Troponin I (High Sensitivity): 5 ng/L (ref <18)

## 2023-12-09 MED ORDER — ENOXAPARIN SODIUM 40 MG/0.4ML IJ SOSY
40.0000 mg | PREFILLED_SYRINGE | INTRAMUSCULAR | Status: DC
  Administered 2023-12-09: 40 mg via SUBCUTANEOUS
  Filled 2023-12-09: qty 0.4

## 2023-12-09 MED ORDER — ONDANSETRON 4 MG PO TBDP
4.0000 mg | ORAL_TABLET | Freq: Once | ORAL | Status: AC
  Administered 2023-12-09: 4 mg via ORAL
  Filled 2023-12-09: qty 1

## 2023-12-09 MED ORDER — LACTATED RINGERS IV SOLN: INTRAVENOUS | Status: DC

## 2023-12-09 MED ORDER — LIDOCAINE HCL (PF) 1 % IJ SOLN
5.0000 mL | Freq: Once | INTRAMUSCULAR | Status: AC
  Administered 2023-12-09: 5 mL
  Filled 2023-12-09: qty 5

## 2023-12-09 MED ORDER — TETANUS-DIPHTH-ACELL PERTUSSIS 5-2.5-18.5 LF-MCG/0.5 IM SUSY
0.5000 mL | PREFILLED_SYRINGE | Freq: Once | INTRAMUSCULAR | Status: AC
  Administered 2023-12-09: 0.5 mL via INTRAMUSCULAR
  Filled 2023-12-09: qty 0.5

## 2023-12-09 MED ORDER — IOHEXOL 350 MG/ML SOLN
75.0000 mL | Freq: Once | INTRAVENOUS | Status: AC | PRN
  Administered 2023-12-09: 75 mL via INTRAVENOUS

## 2023-12-09 MED ORDER — SODIUM CHLORIDE 0.9% FLUSH
3.0000 mL | Freq: Two times a day (BID) | INTRAVENOUS | Status: DC
  Administered 2023-12-09 – 2023-12-10 (×2): 3 mL via INTRAVENOUS

## 2023-12-09 MED ORDER — IRBESARTAN 150 MG PO TABS
75.0000 mg | ORAL_TABLET | Freq: Every day | ORAL | Status: DC
  Filled 2023-12-09: qty 1

## 2023-12-09 MED ORDER — ONDANSETRON HCL 4 MG PO TABS: 4.0000 mg | ORAL_TABLET | Freq: Four times a day (QID) | ORAL | Status: DC | PRN

## 2023-12-09 MED ORDER — ONDANSETRON HCL 4 MG/2ML IJ SOLN: 4.0000 mg | Freq: Four times a day (QID) | INTRAMUSCULAR | Status: DC | PRN

## 2023-12-09 NOTE — ED Notes (Signed)
 ED Provider at bedside.

## 2023-12-09 NOTE — H&P (Signed)
 History and Physical    Patient: [name] QQV:[id number] DOB: 1934/09/01 DOA: 12/09/2023 DOS: the patient was seen and examined on 12/09/2023 PCP: Artemisa Bile, MD  Patient coming from: Home  Chief Complaint:  Chief Complaint  Patient presents with   Fall   HPI: [name] is a 89 y.o. female with medical history significant of GERD, hypertension, osteopenia.  Patient seen for fall that happened earlier today.  Patient was unsure of how or when she fell.  She woke up on the floor in her kitchen.  She does have a laceration on the left cheekbone.  She otherwise feels fine: No fevers, chills, nausea, vomiting, chest pain, shortness of breath.  She has not been falling or passing out recently.  Review of Systems: As mentioned in the history of present illness. All other systems reviewed and are negative. Past Medical History:  Diagnosis Date   Basal cell carcinoma    Chest pain    Diverticulosis    DJD (degenerative joint disease) of cervical spine    GERD (gastroesophageal reflux disease)    Head injury, closed    Headache(784.0)    Osteopenia    Pneumothorax 2006   PSVT (paroxysmal supraventricular tachycardia) (HCC) 2003   Past Surgical History:  Procedure Laterality Date   ABDOMINAL HYSTERECTOMY     APPENDECTOMY     COLONOSCOPY     NISSEN FUNDOPLICATION     TONSILLECTOMY     Social History:  reports that she has never smoked. She has never used smokeless tobacco. She reports that she does not drink alcohol and does not use drugs.  Allergies  Allergen Reactions   Glucose Other (See Comments)    Unknown    Morphine  Nausea And Vomiting    Headache   Sulfonamide Derivatives Other (See Comments)   Vancomycin Other (See Comments)    Unknown    Wheat Swelling    Swelling (eyes), sore spots in head   Penicillins Rash    Family History  Problem Relation Age of Onset   Arthritis Mother    Vasculitis Mother    Heart disease Father    Parkinsonism  Father    Arthritis Sister    Kidney failure Brother    Asthma Sister    Heart disease Sister     Prior to Admission medications   Medication Sig Start Date End Date Taking? Authorizing Provider  aspirin EC 81 MG tablet Take 81 mg by mouth daily. Swallow whole.   Yes [provider]  Calcium  Carbonate Antacid (CALCIUM  CARBONATE PO) Take 1 tablet by mouth daily.   Yes [provider]  Cholecalciferol (VITAMIN D3) 125 MCG (5000 UT) CAPS Take 1 capsule by mouth daily.   Yes [provider]  Ibuprofen (ADVIL LIQUI-GELS MINIS PO) Take 1 capsule by mouth daily as needed (pain).   Yes [provider]  Menaquinone-7 (VITAMIN K2 PO) Take 1 tablet by mouth daily.   Yes [provider]  Multiple Vitamin (MULTIVITAMIN) tablet Take 1 tablet by mouth daily.   Yes [provider]  spironolactone  (ALDACTONE ) 25 MG tablet Take 25 mg by mouth daily.   Yes [provider]  valsartan  (DIOVAN ) 80 MG tablet Take 1 tablet (80 mg total) by mouth daily. 09/12/23  Yes Nahser, Lela Purple, MD    Physical Exam: Vitals:   12/09/23 1615 12/09/23 1630 12/09/23 1700 12/09/23 1800  BP: 128/87 (!) 118/99 139/76 (!) 141/65  Pulse: 68 70 68 70  Resp: 15  18 18 16   Temp:      SpO2: 97% 95% 97% 100%  Weight:      Height:       General: Elderly female. Awake and alert and oriented x3. No acute cardiopulmonary distress.  HEENT: Normocephalic atraumatic.  Right and left ears normal in appearance.  Pupils equal, round, reactive to light. Extraocular muscles are intact. Sclerae anicteric and noninjected.  Moist mucosal membranes. No mucosal lesions.  She has a small laceration on her left cheekbone with some mild bruising.  Neck: Neck supple without lymphadenopathy. No carotid bruits. No masses palpated.  Cardiovascular: Regular rate with normal S1-S2 sounds. No murmurs, rubs, gallops auscultated. No JVD.  Respiratory: Good respiratory effort with no wheezes, rales,  rhonchi. Lungs clear to auscultation bilaterally.  No accessory muscle use. Abdomen: Soft, nontender, nondistended. Active bowel sounds. No masses or hepatosplenomegaly  Skin: No rashes, lesions, or ulcerations.  Dry, warm to touch. 2+ dorsalis pedis and radial pulses. Musculoskeletal: No calf or leg pain. All major joints not erythematous nontender.  No upper or lower joint deformation.  Good ROM.  No contractures  Psychiatric: Intact judgment and insight. Pleasant and cooperative. Neurologic: No focal neurological deficits. Strength is 5/5 and symmetric in upper and lower extremities.  Cranial nerves II through XII are grossly intact.  Data Reviewed: I reviewed all the labs and imaging ordered by the EDP  Assessment and Plan: No notes have been filed under this hospital service. Service: Hospitalist  Principal Problem:   Syncope and collapse Active Problems:   Hypertension   Orthostatic hypotension   Cerebral artery disease  Syncope and collapse Telemetry monitoring Echocardiogram in the morning Cerebral artery disease CTA of head shows some narrowing in M2 of MCA.  Both carotids are patent bilaterally. Hypertension And spironolactone  Continue antihypertensive Orthostasis As history of orthostasis.  Orthostatics here are normal.    Advance Care Planning:   Code Status: Full Code confirmed by patient  Consults: None  Family Communication: Daughter present during interview and exam  Severity of Illness: The appropriate patient status for this patient is OBSERVATION. Observation status is judged to be reasonable and necessary in order to provide the required intensity of service to ensure the patient's safety. The patient's presenting symptoms, physical exam findings, and initial radiographic and laboratory data in the context of their medical condition is felt to place them at decreased risk for further clinical deterioration. Furthermore, it is anticipated that the patient  will be medically stable for discharge from the hospital within 2 midnights of admission.   Author: Danelle Curiale J Berry Gallacher, DO 12/09/2023 7:29 PM  For on call review [email or web address].

## 2023-12-09 NOTE — ED Provider Triage Note (Signed)
 Emergency Medicine Provider Triage Evaluation Note  [name] , a 89 y.o. female  was evaluated in triage.  Pt complains of unwitness fall/syncope. The patient reports that she thinks she fell and woke up on the ground. She complains or facial pain, headache, and right shoulder pain. Denies any chest pain, SOB, or abdominal pain. Reports that she has some lightheadedness and nausea. Daughter reports that she is at her baseline. No blood thinner.   Review of Systems  Positive:  Negative:   Physical Exam  Ht 4' 11 (1.499 m)   Wt 51.3 kg   BMI 22.82 kg/m  Gen:   Awake, no distress   Resp:  Normal effort  MSK:   Moves extremities without difficulty  Other:  Bruising seen around the right eye with some swelling and small tear/laceration. Moving all extremities. Cranial nerves grossly intact.   Medical Decision Making  Medically screening exam initiated at 1:29 PM.  Appropriate orders placed.  Camyah Pultz was informed that the remainder of the evaluation will be completed by another provider, this initial triage assessment does not replace that evaluation, and the importance of remaining in the ED until their evaluation is complete.  Question syncope with concussion or non prodromal syncope. Labs and imaging ordered. Charge nurse aware that the patient needs to be roomed now.     Spence Dux, PA-C 12/09/23 1341

## 2023-12-09 NOTE — ED Provider Notes (Signed)
 [place] EMERGENCY DEPARTMENT AT [facility] Provider Note   CSN: [id number] Arrival date & time: 12/09/23  1315     Patient presents with: [name]   [name] is a 89 y.o. female with history of orthostatic hypotension presents emerged from today for evaluation of fall/questionable syncopal episode.  Patient contributes some to the story however does not have much recollection so daughter provides majority of known history.  Patient does live by herself however has family living on the same property.  Daughter reports that she was called by the patient saying that she had fallen.  The patient reports that she does not remember how she fell or what it happened, she just reports she members waking up on the floor.  She did hit her head on something however is unknown.  Unknown when her last tetanus was either.  She does have a small laceration.  Complaining of headache but no visual changes.  She reports right shoulder pain otherwise has no other pain.  Daughter reports that she is been acting at her baseline.  Has been having some nausea but no belly pain.  Denies any chest pain or shortness of breath.  She did have an episode of diarrhea after the family had gotten there.  She had continence of her bowel and bladder during the questionable fall however.  Fall Associated symptoms include headaches. Pertinent negatives include no chest pain, no abdominal pain and no shortness of breath.       Prior to Admission medications   Medication Sig Start Date End Date Taking? Authorizing Provider  aspirin EC 81 MG tablet Take 81 mg by mouth daily. Swallow whole.   Yes [provider]  Calcium  Carbonate Antacid (CALCIUM  CARBONATE PO) Take 1 tablet by mouth daily.   Yes [provider]  Cholecalciferol (VITAMIN D3) 125 MCG (5000 UT) CAPS Take 1 capsule by mouth daily.   Yes [provider]  Ibuprofen (ADVIL LIQUI-GELS MINIS PO) Take 1 capsule by mouth daily as  needed (pain).   Yes [provider]  Menaquinone-7 (VITAMIN K2 PO) Take 1 tablet by mouth daily.   Yes [provider]  Multiple Vitamin (MULTIVITAMIN) tablet Take 1 tablet by mouth daily.   Yes [provider]  spironolactone  (ALDACTONE ) 25 MG tablet Take 25 mg by mouth daily.   Yes [provider]  valsartan  (DIOVAN ) 80 MG tablet Take 1 tablet (80 mg total) by mouth daily. 09/12/23  Yes Nahser, Lela Purple, MD    Allergies: Glucose, Morphine , Sulfonamide derivatives, Vancomycin, Wheat, and Penicillins    Review of Systems  Constitutional:  Negative for chills and fever.  Eyes:  Negative for pain and visual disturbance.  Respiratory:  Negative for shortness of breath.   Cardiovascular:  Negative for chest pain.  Gastrointestinal:  Positive for nausea. Negative for abdominal pain, constipation, diarrhea and vomiting.  Genitourinary:  Negative for dysuria.  Musculoskeletal:  Negative for arthralgias, back pain, myalgias and neck pain.  Neurological:  Positive for syncope and headaches. Negative for dizziness and light-headedness.    Updated Vital Signs BP (!) 160/79 (BP Location: Right Arm)   Pulse 77   Temp 98 F (36.7 C) (Oral)   Resp 18   Ht 4' 11 (1.499 m)   Wt 50.3 kg   SpO2 96%   BMI 22.40 kg/m   Physical Exam Vitals and nursing note reviewed.  Constitutional:      General: She is not in acute distress.  Appearance: She is not toxic-appearing.  HENT:     Head: Normocephalic.     Comments: No battle signs or raccoon eyes.  No step-off or deformity.  Nontender.    Nose: Nose normal.     Mouth/Throat:     Mouth: Mucous membranes are moist.   Eyes:     General: No scleral icterus.    Extraocular Movements: Extraocular movements intact.     Pupils: Pupils are equal, round, and reactive to light.     Comments: Patient has some periorbital bruising to the left eye with laceration just below the left lower lid.  She has no pain with  movement.  She has no tenderness to the eyeball itself.  No proptosis.   Cardiovascular:     Rate and Rhythm: Normal rate.     Pulses: Normal pulses.  Pulmonary:     Effort: Pulmonary effort is normal. No respiratory distress.     Breath sounds: Normal breath sounds.  Abdominal:     Palpations: Abdomen is soft.     Tenderness: There is no abdominal tenderness. There is no guarding or rebound.   Musculoskeletal:        General: No tenderness or signs of injury.     Cervical back: Normal range of motion. No tenderness.     Right lower leg: No edema.     Left lower leg: No edema.     Comments: Patient has no tenderness to her bilateral upper and lower extremities.  No signs of trauma.  No tenderness to the back.  Palpable radial, DP, and PT pulses 2+ throughout.  Compartments are soft.  She is moving all extremities without difficulty or pain.  Strength is intact in upper and lower extremities as well as sensation reportedly by patient.   Skin:    General: Skin is warm and dry.   Neurological:     General: No focal deficit present.     Mental Status: She is alert. Mental status is at baseline.     Cranial Nerves: No cranial nerve deficit, dysarthria or facial asymmetry.     Sensory: No sensory deficit.     Motor: No weakness or pronator drift.     Comments: Patient does seem little confused however daughter reports that her baseline    (all labs ordered are listed, but only abnormal results are displayed) Labs Reviewed  CBC WITH DIFFERENTIAL/PLATELET - Abnormal; Notable for the following components:      Result Value   WBC 15.8 (*)    Hemoglobin 16.0 (*)    HCT 48.1 (*)    Neutro Abs 13.9 (*)    Abs Immature Granulocytes 0.11 (*)    All other components within normal limits  COMPREHENSIVE METABOLIC PANEL WITH GFR - Abnormal; Notable for the following components:   Sodium 134 (*)    Glucose, Bld 108 (*)    Calcium  8.6 (*)    All other components within normal limits  I-STAT  CHEM 8, ED - Abnormal; Notable for the following components:   Sodium 134 (*)    Potassium 7.4 (*)    BUN 36 (*)    Glucose, Bld 134 (*)    Calcium , Ion 0.96 (*)    Hemoglobin 17.0 (*)    HCT 50.0 (*)    All other components within normal limits  C DIFFICILE QUICK SCREEN W PCR REFLEX    GASTROINTESTINAL PANEL BY PCR, STOOL (REPLACES STOOL CULTURE)  URINALYSIS, ROUTINE W REFLEX MICROSCOPIC  TROPONIN I (  HIGH SENSITIVITY)  TROPONIN I (HIGH SENSITIVITY)    EKG: EKG Interpretation Date/Time:  Tuesday December 09 2023 13:40:43 EDT Ventricular Rate:  78 PR Interval:  142 QRS Duration:  92 QT Interval:  396 QTC Calculation: 451 R Axis:   63  Text Interpretation: Normal sinus rhythm Nonspecific T wave abnormality Abnormal ECG Confirmed by Hiawatha Lout ([id number]) on 12/09/2023 4:12:53 PM  Radiology: CT ANGIO HEAD NECK W WO CM Result Date: 12/09/2023 CLINICAL DATA:  Vertigo, central. Injury to left eye, face, and head after un witnessed fall. EXAM: CT ANGIOGRAPHY HEAD AND NECK WITH AND WITHOUT CONTRAST TECHNIQUE: Multidetector CT imaging of the head and neck was performed using the standard protocol during bolus administration of intravenous contrast. Multiplanar CT image reconstructions and MIPs were obtained to evaluate the vascular anatomy. Carotid stenosis measurements (when applicable) are obtained utilizing NASCET criteria, using the distal internal carotid diameter as the denominator. RADIATION DOSE REDUCTION: This exam was performed according to the departmental dose-optimization program which includes automated exposure control, adjustment of the mA and/or kV according to patient size and/or use of iterative reconstruction technique. CONTRAST:  75mL OMNIPAQUE IOHEXOL 350 MG/ML SOLN COMPARISON:  Same-day CT maxillofacial and CT cervical spine. FINDINGS: CT HEAD FINDINGS Brain: No acute intracranial hemorrhage. No CT evidence of acute infarct. Encephalomalacia in the parasagittal right frontal  lobe suggestive of remote infarct, similar to prior. Remote lacunar infarcts in the bilateral basal ganglia. Nonspecific hypoattenuation in the periventricular and subcortical white matter favored to reflect chronic microvascular ischemic changes. Mild parenchymal volume loss. No edema, mass effect, or midline shift. The basilar cisterns are patent. Ventricles: Prominence of the ventricles suggestive of underlying parenchymal volume loss. Focal calcification involving an M2 branch of the left MCA. Vascular: No hyperdense vessel. Skull: No acute or aggressive finding. Sinuses/orbits: Orbits are symmetric. Complete opacification of the left maxillary sinus with findings suggestive of mucoperiosteal reaction. Other: Mastoid air cells are clear. Soft tissue swelling along the left supraorbital ridge. CTA NECK FINDINGS Aortic arch: Four vessel configuration of the aortic arch. Imaged portion shows no evidence of aneurysm or dissection. Atherosclerosis of the visualized aortic arch. No significant stenosis of the major arch vessel origins. Pulmonary arteries: As permitted by contrast timing, there are no filling defects in the visualized pulmonary arteries. Subclavian arteries: The subclavian arteries are patent bilaterally. Right carotid system: Patent from the origin to the skull base. Mild noncalcified atherosclerosis of the common carotid artery without stenosis. Calcified atherosclerosis at the bifurcation without hemodynamically significant stenosis. Mild tortuosity of the mid cervical ICA. No dissection. Left carotid system: Patent from the origin to the skull base. Atherosclerosis along the mid and distal common carotid artery without significant stenosis. Mild atherosclerosis at the carotid bifurcation without hemodynamically significant stenosis. No dissection. Vertebral arteries: Codominant. No evidence of dissection, stenosis (50% or greater), or occlusion. Mild irregularity of the V1 segment of the left  vertebral artery. Additional irregularity of the right V2 segment with areas of subtle narrowing and outpouching. Skeleton: No acute findings. Degenerative changes in the cervical spine. Disc space narrowing greatest at C4-5. Other neck: The visualized airway is patent. No cervical lymphadenopathy. Multiple thyroid  nodules measuring up to 0.6 cm. Upper chest: No acute abnormality. Review of the MIP images confirms the above findings CTA HEAD FINDINGS ANTERIOR CIRCULATION: The intracranial ICAs are patent bilaterally. Mild atherosclerosis of the left carotid siphon. No significant stenosis, proximal occlusion, aneurysm, or vascular malformation. MCAs: Patent bilaterally. Focal severe stenosis of a distal M2 branch  of the right MCA. Additional severe stenosis of a proximal M3 branch of the right MCA. Severe stenosis of a distal M2 branch of the left MCA. ACAs: Patent bilaterally. Mild stenosis of the A3 segment of the right ACA. Additional moderate stenosis of the left A3 segment. There is severe stenosis of the right A4 segment. POSTERIOR CIRCULATION: No significant stenosis, proximal occlusion, aneurysm, or vascular malformation. PCAs: Patent bilaterally. Fetal origin of the right PCA. Moderate stenosis of the P2 segment right PCA with additional moderate stenosis at the P2/P3 junction on the right. Pcomm: The posterior communicating arteries are visualized bilaterally. SCAs: The superior cerebellar arteries are patent bilaterally. Basilar artery: Patent AICAs: Patent PICAs: Patent Vertebral arteries: The intracranial vertebral arteries are patent. Venous sinuses: As permitted by contrast timing, patent. Anatomic variants: None Review of the MIP images confirms the above findings IMPRESSION: No CT evidence of acute intracranial abnormality. No large vessel occlusion. Multiple intracranial arterial stenoses. Severe stenosis of multiple M2 and M3 branches of the bilateral MCAs. Additional moderate stenosis of the A3  segment left ACA. Severe stenosis of the A4 segment right ACA. Multifocal moderate stenosis of the right PCA. Atherosclerosis in the neck without high-grade stenosis. 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 Remote lacunar infarcts in the bilateral basal ganglia. Chronic left maxillary sinusitis. Aortic Atherosclerosis (ICD10-I70.0). Electronically Signed   By: Denny Flack M.D.   On: 12/09/2023 16:43   CT C-SPINE NO CHARGE Result Date: 12/09/2023 CLINICAL DATA:  Left facial injury, un witnessed fall EXAM: CT CERVICAL SPINE WITHOUT CONTRAST TECHNIQUE: Multidetector CT imaging of the cervical spine was performed without intravenous contrast. Multiplanar CT image reconstructions were also generated. RADIATION DOSE REDUCTION: This exam was performed according to the departmental dose-optimization program which includes automated exposure control, adjustment of the mA and/or kV according to patient size and/or use of iterative reconstruction technique. COMPARISON:  08/15/2004 FINDINGS: Alignment: Mild degenerative retrolisthesis of C4 relative to C5. Otherwise alignment is grossly anatomic. Skull base and vertebrae: No acute fracture. No primary bone lesion or focal pathologic process. Soft tissues and spinal canal: No prevertebral fluid or swelling. No visible canal hematoma. Incidental 1.2 cm right thyroid  nodule, for which no specific imaging follow-up is recommended. Disc levels: There is severe spondylosis at the C4-5 level, with more mild spondylosis at C5-6. Upper chest: Airway is patent. Visualized portions of the lung apices are clear. Other: Please refer to separately reported CT angiography of the neck for vascular findings. Reconstructed images demonstrate no additional findings. IMPRESSION: 1. No evidence of acute cervical spine fracture. 2. Mid cervical spondylosis as above. 3. Please refer to separately reported CT angiography of the neck exam for vascular findings. Electronically Signed    By: Bobbye Burrow M.D.   On: 12/09/2023 15:55   CT Maxillofacial Wo Contrast Result Date: 12/09/2023 CLINICAL DATA:  Blunt facial trauma, left facial injury, un witnessed fall EXAM: CT MAXILLOFACIAL WITHOUT CONTRAST TECHNIQUE: Multidetector CT imaging of the maxillofacial structures was performed. Multiplanar CT image reconstructions were also generated. RADIATION DOSE REDUCTION: This exam was performed according to the departmental dose-optimization program which includes automated exposure control, adjustment of the mA and/or kV according to patient size and/or use of iterative reconstruction technique. COMPARISON:  None Available. FINDINGS: Osseous: There are no acute displaced facial bone fractures. No destructive bony abnormalities. Orbits: Negative. No traumatic or inflammatory finding. Sinuses: There is opacification of the left maxillary sinus, with thickening of the bony margins, consistent with chronic sinus disease. Minimal  retained secretions within the left posterior ethmoid air cells. Remaining paranasal sinuses are clear. Soft tissues: Minimal left supraorbital soft tissue swelling. Remaining soft tissues are unremarkable. Limited intracranial: No significant or unexpected finding. IMPRESSION: 1. No acute displaced facial bone fracture. 2. Chronic left maxillary sinus disease. 3. Minimal left supraorbital soft tissue swelling. Electronically Signed   By: Bobbye Burrow M.D.   On: 12/09/2023 15:50   DG Pelvis Portable Result Date: 12/09/2023 CLINICAL DATA:  Syncope, un witnessed fall EXAM: PORTABLE PELVIS 1-2 VIEWS COMPARISON:  None Available. FINDINGS: Supine frontal view of the pelvis includes both hips. No acute displaced fracture, subluxation, or dislocation. Joint spaces are well preserved. Soft tissues are normal. IMPRESSION: 1. No acute displaced fracture. Electronically Signed   By: Bobbye Burrow M.D.   On: 12/09/2023 15:39   DG Shoulder Right Result Date: 12/09/2023 CLINICAL DATA:   Unwitnessed fall at home today.  The EXAM: RIGHT SHOULDER - 2+ VIEW COMPARISON:  None Available. FINDINGS: There is diffuse decreased bone mineralization. Moderate glenohumeral joint space narrowing and moderate inferior humeral head-neck junction degenerative osteophytosis. No significant acromioclavicular degenerative change. No acute fracture line is seen. Normal alignment. The visualized portion of the right lung is unremarkable. Moderate atherosclerotic calcifications within the aortic arch. IMPRESSION: Moderate glenohumeral osteoarthritis. No acute fracture. Electronically Signed   By: Bertina Broccoli M.D.   On: 12/09/2023 15:37   DG Chest Portable 1 View Result Date: 12/09/2023 CLINICAL DATA:  Syncope with unwitnessed fall EXAM: PORTABLE CHEST 1 VIEW COMPARISON:  Chest radiograph dated 11/18/2014 FINDINGS: Normal lung volumes. Left basilar linear opacity. No pleural effusion or pneumothorax. The heart size and mediastinal contours are within normal limits. No radiographic finding of acute displaced fracture. IMPRESSION: 1. Left basilar linear opacity, likely atelectasis. 2.  No radiographic finding of acute displaced fracture. Electronically Signed   By: Limin  Xu M.D.   On: 12/09/2023 15:32    .Laceration Repair  Date/Time: 12/09/2023 7:30 PM  Performed by: Spence Dux, PA-C Authorized by: Spence Dux, PA-C   Consent:    Consent obtained:  Verbal   Consent given by:  Patient   Risks, benefits, and alternatives were discussed: yes     Risks discussed:  Infection, pain, need for additional repair and poor cosmetic result   Alternatives discussed:  No treatment Universal protocol:    Procedure explained and questions answered to patient or proxy's satisfaction: yes     Imaging studies available: yes     Patient identity confirmed:  Verbally with patient Anesthesia:    Anesthesia method:  Local infiltration and nerve block   Local anesthetic:  Lidocaine 1% w/o epi   Block location:   Infraorbital   Block needle gauge:  25 G   Block anesthetic:  Lidocaine 1% w/o epi   Block injection procedure:  Anatomic landmarks identified, anatomic landmarks palpated, negative aspiration for blood, introduced needle and incremental injection   Block outcome:  Incomplete block Laceration details:    Location:  Face   Face location:  L lower eyelid   Extent:  Partial thickness   Length (cm):  1.5 Pre-procedure details:    Preparation:  Patient was prepped and draped in usual sterile fashion and imaging obtained to evaluate for foreign bodies Exploration:    Imaging outcome: foreign body not noted     Wound exploration: wound explored through full range of motion and entire depth of wound visualized     Contaminated: no   Treatment:    Area  cleansed with:  Saline   Amount of cleaning:  Extensive   Irrigation solution:  Sterile saline Skin repair:    Repair method:  Sutures   Suture size:  6-0   Wound skin closure material used: Vicryl.   Suture technique:  Simple interrupted   Number of sutures:  2 Approximation:    Approximation:  Close Repair type:    Repair type:  Intermediate Post-procedure details:    Procedure completion:  Tolerated well, no immediate complications Comments:     Laceration to the mid to lateral aspect of the lower lid.  Laceration was difficult given the proximity to the water line.  Patient was also moving and felt to be more dangerous to suture the area more medial because of this.  She was able to tolerate the lateral sutures which well-approximated the wound well.  The wound was thoroughly cleansed and irrigated with normal saline.  Hesitant to use any harsher material given the close proximity to the eye    Medications Ordered in the ED  lactated ringers infusion ( Intravenous New Bag/Given 12/09/23 2132)  irbesartan (AVAPRO) tablet 75 mg (has no administration in time range)  sodium chloride  flush (NS) 0.9 % injection 3 mL (3 mLs Intravenous Given  12/09/23 2240)  enoxaparin (LOVENOX) injection 40 mg (40 mg Subcutaneous Given 12/09/23 2238)  ondansetron  (ZOFRAN ) tablet 4 mg (has no administration in time range)    Or  ondansetron  (ZOFRAN ) injection 4 mg (has no administration in time range)  ondansetron  (ZOFRAN -ODT) disintegrating tablet 4 mg (4 mg Oral Given 12/09/23 1338)  iohexol (OMNIPAQUE) 350 MG/ML injection 75 mL (75 mLs Intravenous Contrast Given 12/09/23 1439)  lidocaine (PF) (XYLOCAINE) 1 % injection 5 mL (5 mLs Other Given 12/09/23 2031)  Tdap (BOOSTRIX ) injection 0.5 mL (0.5 mLs Intramuscular Given 12/09/23 1642)    Clinical Course as of 12/10/23 0028  Tue Dec 09, 2023  1419 Will wait for CMP to result for potassium [RR]  1639 Called radiology about delayed read, tech reports that they are currently reading it now.  [RR]    Clinical Course User Index [RR] Spence Dux, PA-C                               Medical Decision Making Amount and/or Complexity of Data Reviewed Labs: ordered. Radiology: ordered.  Risk Prescription drug management. Decision regarding hospitalization.   89 y.o. female presents to the ER for evaluation of fall? Differential diagnosis includes but is not limited to nonprodromal syncope, seizure, cardiac, stroke, mechanical fall concussion.  Vital signs blood pressure elevated however otherwise unremarkable. Physical exam as noted above.   Patient is not on any blood thinners, does have bruising to the left periorbital area nasal bleeding of some mild right shoulder pain however now reports that it is not hurting her.  Will order pelvis and chest x-ray at abundance of precaution as well as CT imaging.  She has no focal neurodeficit.  I independently reviewed and interpreted the patient's labs. Troponin at 4 with repeat at 5.  CBC shows white blood cell count of 50.8.  Hemoglobin concentrated as well as 16 with a hematocrit of 48.1.  CMP shows sodium 134, Leukos at 108, calcium  8.6 otherwise no other  electrolyte abnormality.  Urinalysis only to be collected.  I-STAT Chem-8 showed potassium 7.4 however was obviously hemolyzed given reassuring CMP showing a potassium of 4.1.  Urinalysis still needs to be collected.  Chest x-ray shows left basilar linear opacity likely atelectasis but no acute displaced fracture.  X-ray of the shoulder shows moderate glenohumeral osteoarthritis but no acute fracture.  X-ray of the pelvis shows no acute fracture.    CT maxillofacial shows 1. No acute displaced facial bone fracture. 2. Chronic left maxillary sinus disease. 3. Minimal left supraorbital soft tissue swelling.   CT cervical  1. No evidence of acute cervical spine fracture. 2. Mid cervical spondylosis as above. 3. Please refer to separately reported CT angiography of the neck exam for vascular findings.   CTA head and neck No CT evidence of acute intracranial abnormality. No large vessel occlusion. Multiple intracranial arterial stenoses. Severe stenosis of multiple M2 and M3 branches of the bilateral MCAs. Additional moderate stenosis of the A3 segment left ACA. Severe stenosis of the A4 segment right ACA. Multifocal moderate stenosis of the right PCA. Atherosclerosis in the neck without high-grade stenosis. 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 Remote lacunar infarcts in the bilateral basal ganglia. Chronic left maxillary sinusitis. Aortic Atherosclerosis   Please see suture note.  Questionable with the patient had some sort of cardiogenic syncope, seizure, stroke, or orthostatic hypotension hitting her head causing concussion.  Given her age I do feel it is warranted for syncope workup inpatient.  Discussed this with patient and family members at bedside who are amenable to admission.  Spoke with Dr. Cathyann Cobia for admission.  I discussed this case with my attending physician who cosigned this note including patient's presenting symptoms, physical exam, and planned diagnostics and  interventions. Attending physician stated agreement with plan or made changes to plan which were implemented.   Attending physician assessed patient at bedside.  Portions of this report may have been transcribed using voice recognition software. Every effort was made to ensure accuracy; however, inadvertent computerized transcription errors may be present.    Final diagnoses:  Syncope, unspecified syncope type    ED Discharge Orders     None          Spence Dux, [place] 12/10/23 0057    Mordecai Applebaum, MD 12/10/23 [phone]

## 2023-12-09 NOTE — ED Triage Notes (Addendum)
 Pt arrived via POV from home c/o injury to left eye, face and head following an unwitnessed fall at home today. Pt unsure how she fell, and reports she did lose consciousness. Per family present in Triage, Pt called apprx 1hr PTA reporting she had fallen.

## 2023-12-10 ENCOUNTER — Other Ambulatory Visit (HOSPITAL_COMMUNITY)

## 2023-12-10 DIAGNOSIS — I10 Essential (primary) hypertension: Secondary | ICD-10-CM | POA: Diagnosis not present

## 2023-12-10 DIAGNOSIS — I679 Cerebrovascular disease, unspecified: Secondary | ICD-10-CM | POA: Diagnosis not present

## 2023-12-10 DIAGNOSIS — R55 Syncope and collapse: Secondary | ICD-10-CM

## 2023-12-10 LAB — URINALYSIS, ROUTINE W REFLEX MICROSCOPIC
Bilirubin Urine: NEGATIVE
Glucose, UA: NEGATIVE mg/dL
Hgb urine dipstick: NEGATIVE
Ketones, ur: NEGATIVE mg/dL
Nitrite: NEGATIVE
Protein, ur: NEGATIVE mg/dL
Specific Gravity, Urine: 1.036 — ABNORMAL HIGH (ref 1.005–1.030)
pH: 5 (ref 5.0–8.0)

## 2023-12-10 LAB — CBC WITH DIFFERENTIAL/PLATELET
Abs Immature Granulocytes: 0.03 10*3/uL (ref 0.00–0.07)
Basophils Absolute: 0.1 10*3/uL (ref 0.0–0.1)
Basophils Relative: 1 %
Eosinophils Absolute: 0.1 10*3/uL (ref 0.0–0.5)
Eosinophils Relative: 1 %
HCT: 38.5 % (ref 36.0–46.0)
Hemoglobin: 12.7 g/dL (ref 12.0–15.0)
Immature Granulocytes: 0 %
Lymphocytes Relative: 5 %
Lymphs Abs: 0.5 10*3/uL — ABNORMAL LOW (ref 0.7–4.0)
MCH: 31 pg (ref 26.0–34.0)
MCHC: 33 g/dL (ref 30.0–36.0)
MCV: 93.9 fL (ref 80.0–100.0)
Monocytes Absolute: 0.3 10*3/uL (ref 0.1–1.0)
Monocytes Relative: 4 %
Neutro Abs: 8 10*3/uL — ABNORMAL HIGH (ref 1.7–7.7)
Neutrophils Relative %: 89 %
Platelets: 219 10*3/uL (ref 150–400)
RBC: 4.1 MIL/uL (ref 3.87–5.11)
RDW: 12.8 % (ref 11.5–15.5)
WBC: 9 10*3/uL (ref 4.0–10.5)
nRBC: 0 % (ref 0.0–0.2)

## 2023-12-10 LAB — BASIC METABOLIC PANEL WITH GFR
Anion gap: 8 (ref 5–15)
BUN: 12 mg/dL (ref 8–23)
CO2: 25 mmol/L (ref 22–32)
Calcium: 8.3 mg/dL — ABNORMAL LOW (ref 8.9–10.3)
Chloride: 103 mmol/L (ref 98–111)
Creatinine, Ser: 0.76 mg/dL (ref 0.44–1.00)
GFR, Estimated: >60 mL/min (ref >60)
Glucose, Bld: 88 mg/dL (ref 70–99)
Potassium: 4.1 mmol/L (ref 3.5–5.1)
Sodium: 136 mmol/L (ref 135–145)

## 2023-12-10 LAB — GLUCOSE, CAPILLARY: Glucose-Capillary: 92 mg/dL (ref 70–99)

## 2023-12-10 LAB — C DIFFICILE QUICK SCREEN W PCR REFLEX
C Diff antigen: NEGATIVE
C Diff interpretation: NOT DETECTED
C Diff toxin: NEGATIVE

## 2023-12-10 MED ORDER — VALSARTAN 40 MG PO TABS: 40.0000 mg | ORAL_TABLET | Freq: Every day | ORAL | 0 refills | Status: DC

## 2023-12-10 NOTE — Assessment & Plan Note (Signed)
 Continue aspirin for secondary prophylaxis and continue blood pressure control.  Follow up as outpatient.  Will add home health services with home PT

## 2023-12-10 NOTE — Care Management CC44 (Signed)
 Condition Code 44 Documentation Completed  Patient Details  Name: [name] MRN: [id number] Date of Birth: 09/28/1934   Condition Code 44 given:  Yes Patient signature on Condition Code 44 notice:  Yes Documentation of 2 MD's agreement:  Yes Code 44 added to claim:  Yes    Ander Katos, LCSW 12/10/2023, 1:22 PM

## 2023-12-10 NOTE — Hospital Course (Addendum)
 [name] was admitted to the hospital after suffering a mechanical fall.   89 yo female with the past medical history of GERD, hypertension, and osteopenia who presented after a mechanical fall. She woke up on the kitchen floor, not able to recall if she trip or  lost her consciousness. Apparently she has been working on her yard more than usual lately.  On her initial physical examination her blood pressure was 128/87, HR 68 RR 18 and 02 saturation 97%.  Lungs with no wheezing or rhonchi, heart with S1 and S2 present and regular with no gallops, or rubs, no murmurs, abdomen with no distention and no lower extremity edema. Neurologically non focal.   Na 134, K 4.1 CL 99, bicarbonate 25, glucose 108, bun 21 cr 0.77 AST 23 ALT 15  High sensitive troponin 4 and 5  Wbc 15,8 hgb 16 plt 281   Urine analysis SG 1,036, negative nitrates, negative hgb, large leukocytes, wbc 11-20 and rbc 11-20  Stool C diff negative   CT head and neck with no acute intracranial abnormalities. Multiple intracranial stenoses, severe stenosis of multiple M2 and M3 branches of the bilateral MCAs. Additional moderate stenosis of the A3 segment left ACA. Severe stenosis of the A4 segment right ACA. Multifocal moderate stenosis of the right PCA.  Atherosclerosis in the neck without high grade stenosis.  Remote infarcts in the parasagittal right frontal lobe. Remote lacunar infarcts in the bilateral basal ganglia.   CT maxillofacial with no acute displaced facial bone fracture. Chronic left maxillary sinus disease. Minimal left supra-orbital soft tissue swelling.   Chest radiograph with no cardiomegaly, prominent aortic notch, no effusions, or infiltrates.  Pelvic and right shoulder radiograph with no fracture.   EKG 78 bpm, normal axis, normal intervals, qtc 451, sinus rhythm with no significant ST segment changes, negative T wave V1 to V3.   Patient was placed on IV fluids with improvement in her symptoms.  She was  evaluated by physical therapy with recommendations to continue home health services.

## 2023-12-10 NOTE — Assessment & Plan Note (Addendum)
 Continue blood pressure control with valsartan ,  Patient had negative orthostatic vital signs  08/2023 valsartan  was increased from 40 to 80 mg, her office blood pressure was 150/70 mmHg,   Plan to reduce valsartan  to 40 mg and will stop spironolactone  to avoid possible hypotension and future dehydration.  Defer Echocardiogram as outpatient if needed. Follow up with Cardiology as scheduled.

## 2023-12-10 NOTE — Care Management Obs Status (Signed)
 MEDICARE OBSERVATION STATUS NOTIFICATION   Patient Details  Name: [name] MRN: [id number] Date of Birth: [birth year]   Medicare Observation Status Notification Given:  Yes    Ander Katos, LCSW 12/10/2023, 1:22 PM

## 2023-12-10 NOTE — Plan of Care (Signed)
  Problem: Acute Rehab PT Goals(only PT should resolve) Goal: Pt Will Go Supine/Side To Sit Flowsheets (Taken 12/10/2023 1054) Pt will go Supine/Side to Sit: with modified independence Goal: Patient Will Transfer Sit To/From Stand Flowsheets (Taken 12/10/2023 1054) Patient will transfer sit to/from stand: with modified independence Goal: Pt Will Transfer Bed To Chair/Chair To Bed Flowsheets (Taken 12/10/2023 1054) Pt will Transfer Bed to Chair/Chair to Bed: with modified independence Goal: Pt Will Ambulate Flowsheets (Taken 12/10/2023 1054) Pt will Ambulate:  50 feet  with modified independence  with least restrictive assistive device  Gatha Kaska PT, DPT [facility] [place] [phone] office

## 2023-12-10 NOTE — Discharge Summary (Addendum)
 Physician Discharge Summary   Patient: [name] MRN: [id number] DOB: Dec 09, 1934  Admit date:     12/09/2023  Discharge date: 12/10/23  Discharge Physician: Curlee Doss Jeweline Reif   PCP: Artemisa Bile, MD   Recommendations at discharge:    Valsartan  reduced back to 40 mg daily and stopped spironolactone  Follow up blood pressure as outpatient, home health services will be arranges.  Follow up with Dr Hildy Lowers in 7 to 10 days Follow up with Cardiology as scheduled.   I spoke with patient's daughter at the bedside, we talked in detail about patient's condition, plan of care and prognosis and all questions were addressed.   Discharge Diagnoses: Principal Problem:   Syncope and collapse Active Problems:   Hypertension   Orthostatic hypotension   Cerebral artery disease  Resolved Problems:   * No resolved hospital problems. [facility] Course: [name] was admitted to the hospital after suffering a mechanical fall.   89 yo female with the past medical history of GERD, hypertension, and osteopenia who presented after a mechanical fall. She woke up on the kitchen floor, not able to recall if she trip or  lost her consciousness. Apparently she has been working on her yard more than usual lately.  On her initial physical examination her blood pressure was 128/87, HR 68 RR 18 and 02 saturation 97%.  Lungs with no wheezing or rhonchi, heart with S1 and S2 present and regular with no gallops, or rubs, no murmurs, abdomen with no distention and no lower extremity edema. Neurologically non focal.   Na 134, K 4.1 CL 99, bicarbonate 25, glucose 108, bun 21 cr 0.77 AST 23 ALT 15  High sensitive troponin 4 and 5  Wbc 15,8 hgb 16 plt 281   Urine analysis SG 1,036, negative nitrates, negative hgb, large leukocytes, wbc 11-20 and rbc 11-20  Stool C diff negative   CT head and neck with no acute intracranial abnormalities. Multiple intracranial stenoses, severe stenosis of multiple M2  and M3 branches of the bilateral MCAs. Additional moderate stenosis of the A3 segment left ACA. Severe stenosis of the A4 segment right ACA. Multifocal moderate stenosis of the right PCA.  Atherosclerosis in the neck without high grade stenosis.  Remote infarcts in the parasagittal right frontal lobe. Remote lacunar infarcts in the bilateral basal ganglia.   CT maxillofacial with no acute displaced facial bone fracture. Chronic left maxillary sinus disease. Minimal left supra-orbital soft tissue swelling.   Chest radiograph with no cardiomegaly, prominent aortic notch, no effusions, or infiltrates.  Pelvic and right shoulder radiograph with no fracture.   EKG 78 bpm, normal axis, normal intervals, qtc 451, sinus rhythm with no significant ST segment changes, negative T wave V1 to V3.   Patient was placed on IV fluids with improvement in her symptoms.  She was evaluated by physical therapy with recommendations to continue home health services.   Assessment and Plan: * Syncope and collapse No clear if patient had a syncope episode.  On admission she had signs of dehydration with concentrated urine and hyponatremia.   Patient was placed on IV fluids with balanced electrolyte solutions with improvement in her symptoms Her follow up Na is 136 and her serum cr continue to be stable at 0,76.  Plan to continue oral hydration at home and avoid been exposed to high temperatures outside her home.   Reactive leukocytosis has resolved, no signs of bacterial infection, no indication for antibiotic therapy.   Hypertension Continue blood pressure  control with valsartan ,  Patient had negative orthostatic vital signs  08/2023 valsartan  was increased from 40 to 80 mg, her office blood pressure was 150/70 mmHg,   Plan to reduce valsartan  to 40 mg and will stop spironolactone  to avoid possible hypotension and future dehydration.  Defer Echocardiogram as outpatient if needed. Follow up with Cardiology as  scheduled.   Cerebral artery disease Continue aspirin for secondary prophylaxis and continue blood pressure control.  Follow up as outpatient.  Will add home health services with home PT    Consultants: none  Procedures performed: none   Disposition: Home Diet recommendation:  Regular diet DISCHARGE MEDICATION: Allergies as of 12/10/2023       Reactions   Glucose Other (See Comments)   Unknown    Morphine  Nausea And Vomiting   Headache   Sulfonamide Derivatives Other (See Comments)   Vancomycin Other (See Comments)   Unknown    Wheat Swelling   Swelling (eyes), sore spots in head   Penicillins Rash        Medication List     STOP taking these medications    ADVIL LIQUI-GELS MINIS PO   spironolactone  25 MG tablet Commonly known as: ALDACTONE        TAKE these medications    aspirin EC 81 MG tablet Take 81 mg by mouth daily. Swallow whole.   CALCIUM  CARBONATE PO Take 1 tablet by mouth daily.   multivitamin tablet Take 1 tablet by mouth daily.   valsartan  40 MG tablet Commonly known as: Diovan  Take 1 tablet (40 mg total) by mouth daily. What changed:  medication strength how much to take   Vitamin D3 125 MCG (5000 UT) Caps Take 1 capsule by mouth daily.   VITAMIN K2 PO Take 1 tablet by mouth daily.        Discharge Exam: Filed Weights   12/09/23 1329 12/09/23 2058 12/10/23 0500  Weight: 51.2 kg 50.3 kg 50.8 kg   BP (!) 129/59 (BP Location: Right Arm)   Pulse 78   Temp 98.1 F (36.7 C) (Oral)   Resp (!) 22   Ht 4' 11 (1.499 m)   Wt 50.8 kg   SpO2 97%   BMI 22.62 kg/m   Patient is feeling better, with no chest pain or dyspnea, no diarrhea, or urinary symptoms  Neurology awake and alert ENT with mild pallor with no icterus, positive ecchymosis left peri orbital region  Cardiovascular with S1 and S2 present and regular with no gallops, rubs or murmurs Respiratory with no rales or wheezing, no rhonchi  Abdomen with no distention   No lower extremity edema   Condition at discharge: stable  The results of significant diagnostics from this hospitalization (including imaging, microbiology, ancillary and laboratory) are listed below for reference.   Imaging Studies: CT ANGIO HEAD NECK W WO CM Result Date: 12/09/2023 CLINICAL DATA:  Vertigo, central. Injury to left eye, face, and head after un witnessed fall. EXAM: CT ANGIOGRAPHY HEAD AND NECK WITH AND WITHOUT CONTRAST TECHNIQUE: Multidetector CT imaging of the head and neck was performed using the standard protocol during bolus administration of intravenous contrast. Multiplanar CT image reconstructions and MIPs were obtained to evaluate the vascular anatomy. Carotid stenosis measurements (when applicable) are obtained utilizing NASCET criteria, using the distal internal carotid diameter as the denominator. RADIATION DOSE REDUCTION: This exam was performed according to the departmental dose-optimization program which includes automated exposure control, adjustment of the mA and/or kV according to patient size and/or use of  iterative reconstruction technique. CONTRAST:  75mL OMNIPAQUE IOHEXOL 350 MG/ML SOLN COMPARISON:  Same-day CT maxillofacial and CT cervical spine. FINDINGS: CT HEAD FINDINGS Brain: No acute intracranial hemorrhage. No CT evidence of acute infarct. Encephalomalacia in the parasagittal right frontal lobe suggestive of remote infarct, similar to prior. Remote lacunar infarcts in the bilateral basal ganglia. Nonspecific hypoattenuation in the periventricular and subcortical white matter favored to reflect chronic microvascular ischemic changes. Mild parenchymal volume loss. No edema, mass effect, or midline shift. The basilar cisterns are patent. Ventricles: Prominence of the ventricles suggestive of underlying parenchymal volume loss. Focal calcification involving an M2 branch of the left MCA. Vascular: No hyperdense vessel. Skull: No acute or aggressive finding.  Sinuses/orbits: Orbits are symmetric. Complete opacification of the left maxillary sinus with findings suggestive of mucoperiosteal reaction. Other: Mastoid air cells are clear. Soft tissue swelling along the left supraorbital ridge. CTA NECK FINDINGS Aortic arch: Four vessel configuration of the aortic arch. Imaged portion shows no evidence of aneurysm or dissection. Atherosclerosis of the visualized aortic arch. No significant stenosis of the major arch vessel origins. Pulmonary arteries: As permitted by contrast timing, there are no filling defects in the visualized pulmonary arteries. Subclavian arteries: The subclavian arteries are patent bilaterally. Right carotid system: Patent from the origin to the skull base. Mild noncalcified atherosclerosis of the common carotid artery without stenosis. Calcified atherosclerosis at the bifurcation without hemodynamically significant stenosis. Mild tortuosity of the mid cervical ICA. No dissection. Left carotid system: Patent from the origin to the skull base. Atherosclerosis along the mid and distal common carotid artery without significant stenosis. Mild atherosclerosis at the carotid bifurcation without hemodynamically significant stenosis. No dissection. Vertebral arteries: Codominant. No evidence of dissection, stenosis (50% or greater), or occlusion. Mild irregularity of the V1 segment of the left vertebral artery. Additional irregularity of the right V2 segment with areas of subtle narrowing and outpouching. Skeleton: No acute findings. Degenerative changes in the cervical spine. Disc space narrowing greatest at C4-5. Other neck: The visualized airway is patent. No cervical lymphadenopathy. Multiple thyroid  nodules measuring up to 0.6 cm. Upper chest: No acute abnormality. Review of the MIP images confirms the above findings CTA HEAD FINDINGS ANTERIOR CIRCULATION: The intracranial ICAs are patent bilaterally. Mild atherosclerosis of the left carotid siphon. No  significant stenosis, proximal occlusion, aneurysm, or vascular malformation. MCAs: Patent bilaterally. Focal severe stenosis of a distal M2 branch of the right MCA. Additional severe stenosis of a proximal M3 branch of the right MCA. Severe stenosis of a distal M2 branch of the left MCA. ACAs: Patent bilaterally. Mild stenosis of the A3 segment of the right ACA. Additional moderate stenosis of the left A3 segment. There is severe stenosis of the right A4 segment. POSTERIOR CIRCULATION: No significant stenosis, proximal occlusion, aneurysm, or vascular malformation. PCAs: Patent bilaterally. Fetal origin of the right PCA. Moderate stenosis of the P2 segment right PCA with additional moderate stenosis at the P2/P3 junction on the right. Pcomm: The posterior communicating arteries are visualized bilaterally. SCAs: The superior cerebellar arteries are patent bilaterally. Basilar artery: Patent AICAs: Patent PICAs: Patent Vertebral arteries: The intracranial vertebral arteries are patent. Venous sinuses: As permitted by contrast timing, patent. Anatomic variants: None Review of the MIP images confirms the above findings IMPRESSION: No CT evidence of acute intracranial abnormality. No large vessel occlusion. Multiple intracranial arterial stenoses. Severe stenosis of multiple M2 and M3 branches of the bilateral MCAs. Additional moderate stenosis of the A3 segment left ACA. Severe stenosis of the  A4 segment right ACA. Multifocal moderate stenosis of the right PCA. Atherosclerosis in the neck without high-grade stenosis. 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 Remote lacunar infarcts in the bilateral basal ganglia. Chronic left maxillary sinusitis. Aortic Atherosclerosis (ICD10-I70.0). Electronically Signed   By: Denny Flack M.D.   On: 12/09/2023 16:43   CT C-SPINE NO CHARGE Result Date: 12/09/2023 CLINICAL DATA:  Left facial injury, un witnessed fall EXAM: CT CERVICAL SPINE WITHOUT CONTRAST TECHNIQUE:  Multidetector CT imaging of the cervical spine was performed without intravenous contrast. Multiplanar CT image reconstructions were also generated. RADIATION DOSE REDUCTION: This exam was performed according to the departmental dose-optimization program which includes automated exposure control, adjustment of the mA and/or kV according to patient size and/or use of iterative reconstruction technique. COMPARISON:  08/15/2004 FINDINGS: Alignment: Mild degenerative retrolisthesis of C4 relative to C5. Otherwise alignment is grossly anatomic. Skull base and vertebrae: No acute fracture. No primary bone lesion or focal pathologic process. Soft tissues and spinal canal: No prevertebral fluid or swelling. No visible canal hematoma. Incidental 1.2 cm right thyroid  nodule, for which no specific imaging follow-up is recommended. Disc levels: There is severe spondylosis at the C4-5 level, with more mild spondylosis at C5-6. Upper chest: Airway is patent. Visualized portions of the lung apices are clear. Other: Please refer to separately reported CT angiography of the neck for vascular findings. Reconstructed images demonstrate no additional findings. IMPRESSION: 1. No evidence of acute cervical spine fracture. 2. Mid cervical spondylosis as above. 3. Please refer to separately reported CT angiography of the neck exam for vascular findings. Electronically Signed   By: Bobbye Burrow M.D.   On: 12/09/2023 15:55   CT Maxillofacial Wo Contrast Result Date: 12/09/2023 CLINICAL DATA:  Blunt facial trauma, left facial injury, un witnessed fall EXAM: CT MAXILLOFACIAL WITHOUT CONTRAST TECHNIQUE: Multidetector CT imaging of the maxillofacial structures was performed. Multiplanar CT image reconstructions were also generated. RADIATION DOSE REDUCTION: This exam was performed according to the departmental dose-optimization program which includes automated exposure control, adjustment of the mA and/or kV according to patient size and/or  use of iterative reconstruction technique. COMPARISON:  None Available. FINDINGS: Osseous: There are no acute displaced facial bone fractures. No destructive bony abnormalities. Orbits: Negative. No traumatic or inflammatory finding. Sinuses: There is opacification of the left maxillary sinus, with thickening of the bony margins, consistent with chronic sinus disease. Minimal retained secretions within the left posterior ethmoid air cells. Remaining paranasal sinuses are clear. Soft tissues: Minimal left supraorbital soft tissue swelling. Remaining soft tissues are unremarkable. Limited intracranial: No significant or unexpected finding. IMPRESSION: 1. No acute displaced facial bone fracture. 2. Chronic left maxillary sinus disease. 3. Minimal left supraorbital soft tissue swelling. Electronically Signed   By: Bobbye Burrow M.D.   On: 12/09/2023 15:50   DG Pelvis Portable Result Date: 12/09/2023 CLINICAL DATA:  Syncope, un witnessed fall EXAM: PORTABLE PELVIS 1-2 VIEWS COMPARISON:  None Available. FINDINGS: Supine frontal view of the pelvis includes both hips. No acute displaced fracture, subluxation, or dislocation. Joint spaces are well preserved. Soft tissues are normal. IMPRESSION: 1. No acute displaced fracture. Electronically Signed   By: Bobbye Burrow M.D.   On: 12/09/2023 15:39   DG Shoulder Right Result Date: 12/09/2023 CLINICAL DATA:  Unwitnessed fall at home today.  The EXAM: RIGHT SHOULDER - 2+ VIEW COMPARISON:  None Available. FINDINGS: There is diffuse decreased bone mineralization. Moderate glenohumeral joint space narrowing and moderate inferior humeral head-neck junction degenerative osteophytosis. No  significant acromioclavicular degenerative change. No acute fracture line is seen. Normal alignment. The visualized portion of the right lung is unremarkable. Moderate atherosclerotic calcifications within the aortic arch. IMPRESSION: Moderate glenohumeral osteoarthritis. No acute fracture.  Electronically Signed   By: Bertina Broccoli M.D.   On: 12/09/2023 15:37   DG Chest Portable 1 View Result Date: 12/09/2023 CLINICAL DATA:  Syncope with unwitnessed fall EXAM: PORTABLE CHEST 1 VIEW COMPARISON:  Chest radiograph dated 11/18/2014 FINDINGS: Normal lung volumes. Left basilar linear opacity. No pleural effusion or pneumothorax. The heart size and mediastinal contours are within normal limits. No radiographic finding of acute displaced fracture. IMPRESSION: 1. Left basilar linear opacity, likely atelectasis. 2.  No radiographic finding of acute displaced fracture. Electronically Signed   By: Limin  Xu M.D.   On: 12/09/2023 15:32    Microbiology: Results for orders placed or performed during the hospital encounter of 12/09/23  C Difficile Quick Screen w PCR reflex     Status: None   Collection Time: 12/10/23 12:30 AM   Specimen: STOOL  Result Value Ref Range Status   C Diff antigen NEGATIVE NEGATIVE Final   C Diff toxin NEGATIVE NEGATIVE Final   C Diff interpretation No C. difficile detected.  Final    Comment: Performed at [facility], [address], [place], [place] [address]    Labs: CBC: Recent Labs  Lab 12/09/23 1400 12/09/23 1415 12/10/23 1151  WBC 15.8*  --  9.0  NEUTROABS 13.9*  --  8.0*  HGB 16.0* 17.0* 12.7  HCT 48.1* 50.0* 38.5  MCV 94.3  --  93.9  PLT 281  --  219   Basic Metabolic Panel: Recent Labs  Lab 12/09/23 1415 12/09/23 1506 12/10/23 1151  NA 134* 134* 136  K 7.4* 4.1 4.1  CL 106 99 103  CO2  --  25 25  GLUCOSE 134* 108* 88  BUN 36* 21 12  CREATININE 0.90 0.77 0.76  CALCIUM   --  8.6* 8.3*   Liver Function Tests: Recent Labs  Lab 12/09/23 1506  AST 23  ALT 15  ALKPHOS 86  BILITOT 0.4  PROT 6.8  ALBUMIN 3.9   CBG: Recent Labs  Lab 12/10/23 0414  GLUCAP 92    Discharge time spent: greater than 30 minutes.  Signed: Albertus Alt, MD [facility] 12/10/2023

## 2023-12-10 NOTE — Plan of Care (Signed)
  Problem: Education: Goal: Knowledge of General Education information will improve Description: Including pain rating scale, medication(s)/side effects and non-pharmacologic comfort measures Outcome: Progressing   Problem: Health Behavior/Discharge Planning: Goal: Ability to manage health-related needs will improve Outcome: Progressing   Problem: Clinical Measurements: Goal: Will remain free from infection Outcome: Progressing Goal: Cardiovascular complication will be avoided Outcome: Progressing   Problem: Activity: Goal: Risk for activity intolerance will decrease Outcome: Progressing   Problem: Elimination: Goal: Will not experience complications related to bowel motility Outcome: Progressing Goal: Will not experience complications related to urinary retention Outcome: Progressing   Problem: Pain Managment: Goal: General experience of comfort will improve and/or be controlled Outcome: Progressing   Problem: Safety: Goal: Ability to remain free from injury will improve Outcome: Progressing   Problem: Skin Integrity: Goal: Risk for impaired skin integrity will decrease Outcome: Progressing

## 2023-12-10 NOTE — Assessment & Plan Note (Addendum)
 No clear if patient had a syncope episode.  On admission she had signs of dehydration with concentrated urine and hyponatremia.   Patient was placed on IV fluids with balanced electrolyte solutions with improvement in her symptoms Her follow up Na is 136 and her serum cr continue to be stable at 0,76.  Plan to continue oral hydration at home and avoid been exposed to high temperatures outside her home.   Reactive leukocytosis has resolved, no signs of bacterial infection, no indication for antibiotic therapy.

## 2023-12-10 NOTE — TOC Transition Note (Signed)
 Transition of Care [facility]) - Discharge Note   Patient Details  Name: [name] MRN: [id number] Date of Birth: 05-Feb-1935  Transition of Care [facility]) CM/SW Contact:  Ander Katos, LCSW Phone Number: 12/10/2023, 12:54 PM   Clinical Narrative: PT recommends HHPT. LCSW discussed with pt and daughter at bedside who are agreeable. No preference on agency. Referred and accepted by [facility] with [facility] for HHPT/RN. Orders in. [facility] aware of d/c today.       Final next level of care: Home w Home Health Services Barriers to Discharge: Barriers Resolved   Patient Goals and CMS Choice Patient states their goals for this hospitalization and ongoing recovery are:: return home   Choice offered to / list presented to : Patient [place] ownership interest in [facility].provided to::  (n/a)    Discharge Placement                       Discharge Plan and Services Additional resources added to the After Visit Summary for                            [facility] Arranged: PT, RN [facility] Agency: [facility] Date [facility] Agency Contacted: 12/10/23 Time HH Agency Contacted: [phone] Representative spoke with at [facility] Agency: [facility]  Social Drivers of Health (SDOH) Interventions SDOH Screenings   Food Insecurity: No Food Insecurity (12/09/2023)  Housing: Low Risk  (12/09/2023)  Transportation Needs: No Transportation Needs (12/09/2023)  Utilities: Not At Risk (12/09/2023)  Social Connections: Moderately Integrated (12/09/2023)  Tobacco Use: Low Risk  (12/09/2023)     Readmission Risk Interventions     No data to display

## 2023-12-10 NOTE — Evaluation (Signed)
 Physical Therapy Evaluation Patient Details Name: [name] MRN: [id number] DOB: 1935-03-01 Today's Date: 12/10/2023  History of Present Illness  a 89 y.o. female with medical history significant of GERD, hypertension, osteopenia.  Patient seen for fall that happened earlier today.  Patient was unsure of how or when she fell.  She woke up on the floor in her kitchen.  She does have a laceration on the left cheekbone.  She otherwise feels fine: No fevers, chills, nausea, vomiting, chest pain, shortness of breath.  She has not been falling or passing out recently. (per MD)  Clinical Impression   Pt tolerated today's Physical Therapy Evaluation, well with good carryover for room level ambulation with RW, bed/chair transfer to toilet with CGA and multiple sit/stand transfers at supervision.Pt's daughter active and present during total evaluation, with some support being provided during evaluation. Pt's baseline is independence with all mobility and ADLs, use QC for 'peace of mind' during ambulation. Pt's family live on same property and are within walking distance and can assist when able. Currently, pt demonstrating mild balance deficts and unsteadiness due to muscle weakness which is limiting pt to complete ADLs and ambulation safely. Pt is agreeable to home health PT services. See chart for Orthostatic readings, no drops during mobility.   Based upon these deficits/impairments, patient will benefit from continued skilled physical therapy services during remainder of hospital stay and at the next recommended venue of care to address deficits and promote return to optimal function.                If plan is discharge home, recommend the following: A little help with walking and/or transfers;A little help with bathing/dressing/bathroom   Can travel by private vehicle        Equipment Recommendations None recommended by PT  Recommendations for Other Services       Functional  Status Assessment Patient has had a recent decline in their functional status and demonstrates the ability to make significant improvements in function in a reasonable and predictable amount of time.     Precautions / Restrictions Precautions Precautions: None Recall of Precautions/Restrictions: Intact Restrictions Weight Bearing Restrictions Per Provider Order: No      Mobility  Bed Mobility Overal bed mobility: Needs Assistance Bed Mobility: Supine to Sit     Supine to sit: Contact guard     General bed mobility comments: slow, labored,  use of bed rail Patient Response: Cooperative  Transfers Overall transfer level: Needs assistance Equipment used: Rolling walker (2 wheels), None Transfers: Sit to/from Stand, Bed to chair/wheelchair/BSC Sit to Stand: Supervision, Contact guard assist Stand pivot transfers: Contact guard assist         General transfer comment: CGA progress to supervision with multiple attempts, increased lean posteriorly. slow, labored. 1x toilet transfer from standing with CGA and verbal cues for hand placement on grab bars.    Ambulation/Gait Ambulation/Gait assistance: Supervision Gait Distance (Feet): 24 Feet Assistive device: Rolling walker (2 wheels) Gait Pattern/deviations: WFL(Within Functional Limits), Step-through pattern       General Gait Details: slow, labored at supervision level.  Stairs            Wheelchair Mobility     Tilt Bed Tilt Bed Patient Response: Cooperative  Modified Rankin (Stroke Patients Only)       Balance Overall balance assessment: Needs assistance Sitting-balance support: No upper extremity supported, Feet supported Sitting balance-Leahy Scale: Fair Sitting balance - Comments: sitting EOB -fair; sitting in relciner- normal  Standing balance support: Bilateral upper extremity supported, During functional activity, Reliant on assistive device for balance Standing balance-Leahy Scale:  Fair Standing balance comment: fair without AD- good with AD                             Pertinent Vitals/Pain Pain Assessment Pain Assessment: Faces Faces Pain Scale: Hurts a little bit Pain Location: left eye Pain Descriptors / Indicators: Aching Pain Intervention(s): Monitored during session    Home Living Family/patient expects to be discharged to:: Private residence Living Arrangements: Alone Available Help at Discharge: Family;Available 24 hours/day Type of Home: House       Alternate Level Stairs-Number of Steps: 2 STE with platforms Home Layout: Two level;Able to live on main level with bedroom/bathroom Home Equipment: Rolling Walker (2 wheels);Cane - single point;Wheelchair - manual;Grab bars - toilet;Grab bars - tub/shower;Shower seat;Cane - quad      Prior Function Prior Level of Function : Independent/Modified Independent             Mobility Comments: Pt takes quad cane with her for safety, but doesn't use it. ADLs Comments: Independent     Extremity/Trunk Assessment   Upper Extremity Assessment Upper Extremity Assessment: Overall WFL for tasks assessed    Lower Extremity Assessment Lower Extremity Assessment: Overall WFL for tasks assessed;RLE deficits/detail;LLE deficits/detail RLE Deficits / Details: 4-/5 knee extension and ankle dorsiflexion MMT RLE Sensation: WNL LLE Deficits / Details: 4-/5 knee extension and ankle dorsiflexion MMT LLE Sensation: WNL    Cervical / Trunk Assessment Cervical / Trunk Assessment: Kyphotic  Communication   Communication Communication: No apparent difficulties    Cognition Arousal: Alert Behavior During Therapy: WFL for tasks assessed/performed   PT - Cognitive impairments: No apparent impairments                         Following commands: Intact       Cueing       General Comments      Exercises     Assessment/Plan    PT Assessment Patient needs continued PT services   PT Problem List Decreased strength;Decreased activity tolerance;Decreased balance;Decreased mobility       PT Treatment Interventions Functional mobility training;Therapeutic activities;Therapeutic exercise;Balance training;Neuromuscular re-education    PT Goals (Current goals can be found in the Care Plan section)  Acute Rehab PT Goals Patient Stated Goal: return home with family to assist PT Goal Formulation: With patient Time For Goal Achievement: 12/24/23 Potential to Achieve Goals: Good    Frequency Min 2X/week     Co-evaluation               AM-PAC PT 6 Clicks Mobility  Outcome Measure Help needed turning from your back to your side while in a flat bed without using bedrails?: A Little Help needed moving from lying on your back to sitting on the side of a flat bed without using bedrails?: A Little Help needed moving to and from a bed to a chair (including a wheelchair)?: A Little Help needed standing up from a chair using your arms (e.g., wheelchair or bedside chair)?: A Little Help needed to walk in hospital room?: A Little Help needed climbing 3-5 steps with a railing? : A Lot 6 Click Score: 17    End of Session Equipment Utilized During Treatment: Gait belt   Patient left: in chair;with call bell/phone within reach;with family/visitor present Nurse Communication: Mobility  status PT Visit Diagnosis: Unsteadiness on feet (R26.81);Muscle weakness (generalized) (M62.81)    Time: 7829-5621 PT Time Calculation (min) (ACUTE ONLY): 37 min   Charges:   PT Evaluation $PT Eval Low Complexity: 1 Low PT Treatments $Therapeutic Activity: 23-37 mins PT General Charges $$ ACUTE PT VISIT: 1 Visit         [name], DPT [facility] [place] [address] [phone] office  Gatha Kaska 12/10/2023, 10:49 AM

## 2023-12-11 LAB — GASTROINTESTINAL PANEL BY PCR, STOOL (REPLACES STOOL CULTURE)

## 2023-12-13 DIAGNOSIS — Z9089 Acquired absence of other organs: Secondary | ICD-10-CM | POA: Diagnosis not present

## 2023-12-13 DIAGNOSIS — M8550 Aneurysmal bone cyst, unspecified site: Secondary | ICD-10-CM | POA: Diagnosis not present

## 2023-12-13 DIAGNOSIS — K219 Gastro-esophageal reflux disease without esophagitis: Secondary | ICD-10-CM | POA: Diagnosis not present

## 2023-12-13 DIAGNOSIS — M47812 Spondylosis without myelopathy or radiculopathy, cervical region: Secondary | ICD-10-CM | POA: Diagnosis not present

## 2023-12-13 DIAGNOSIS — C4491 Basal cell carcinoma of skin, unspecified: Secondary | ICD-10-CM | POA: Diagnosis not present

## 2023-12-13 DIAGNOSIS — Z556 Problems related to health literacy: Secondary | ICD-10-CM | POA: Diagnosis not present

## 2023-12-13 DIAGNOSIS — I471 Supraventricular tachycardia, unspecified: Secondary | ICD-10-CM | POA: Diagnosis not present

## 2023-12-13 DIAGNOSIS — I951 Orthostatic hypotension: Secondary | ICD-10-CM | POA: Diagnosis not present

## 2023-12-13 DIAGNOSIS — Z9181 History of falling: Secondary | ICD-10-CM | POA: Diagnosis not present

## 2023-12-13 DIAGNOSIS — I10 Essential (primary) hypertension: Secondary | ICD-10-CM | POA: Diagnosis not present

## 2023-12-13 DIAGNOSIS — K5790 Diverticulosis of intestine, part unspecified, without perforation or abscess without bleeding: Secondary | ICD-10-CM | POA: Diagnosis not present

## 2023-12-13 DIAGNOSIS — I6603 Occlusion and stenosis of bilateral middle cerebral arteries: Secondary | ICD-10-CM | POA: Diagnosis not present

## 2023-12-13 DIAGNOSIS — E876 Hypokalemia: Secondary | ICD-10-CM | POA: Diagnosis not present

## 2023-12-16 DIAGNOSIS — R55 Syncope and collapse: Secondary | ICD-10-CM | POA: Diagnosis not present

## 2023-12-16 DIAGNOSIS — I10 Essential (primary) hypertension: Secondary | ICD-10-CM | POA: Diagnosis not present

## 2023-12-18 ENCOUNTER — Telehealth: Payer: Self-pay | Admitting: Student

## 2023-12-18 DIAGNOSIS — E876 Hypokalemia: Secondary | ICD-10-CM | POA: Diagnosis not present

## 2023-12-18 DIAGNOSIS — M8550 Aneurysmal bone cyst, unspecified site: Secondary | ICD-10-CM | POA: Diagnosis not present

## 2023-12-18 DIAGNOSIS — Z9089 Acquired absence of other organs: Secondary | ICD-10-CM | POA: Diagnosis not present

## 2023-12-18 DIAGNOSIS — Z9181 History of falling: Secondary | ICD-10-CM | POA: Diagnosis not present

## 2023-12-18 DIAGNOSIS — M47812 Spondylosis without myelopathy or radiculopathy, cervical region: Secondary | ICD-10-CM | POA: Diagnosis not present

## 2023-12-18 DIAGNOSIS — I10 Essential (primary) hypertension: Secondary | ICD-10-CM | POA: Diagnosis not present

## 2023-12-18 DIAGNOSIS — K219 Gastro-esophageal reflux disease without esophagitis: Secondary | ICD-10-CM | POA: Diagnosis not present

## 2023-12-18 DIAGNOSIS — C4491 Basal cell carcinoma of skin, unspecified: Secondary | ICD-10-CM | POA: Diagnosis not present

## 2023-12-18 DIAGNOSIS — I471 Supraventricular tachycardia, unspecified: Secondary | ICD-10-CM | POA: Diagnosis not present

## 2023-12-18 DIAGNOSIS — Z556 Problems related to health literacy: Secondary | ICD-10-CM | POA: Diagnosis not present

## 2023-12-18 DIAGNOSIS — I951 Orthostatic hypotension: Secondary | ICD-10-CM | POA: Diagnosis not present

## 2023-12-18 DIAGNOSIS — I6603 Occlusion and stenosis of bilateral middle cerebral arteries: Secondary | ICD-10-CM | POA: Diagnosis not present

## 2023-12-18 DIAGNOSIS — K5790 Diverticulosis of intestine, part unspecified, without perforation or abscess without bleeding: Secondary | ICD-10-CM | POA: Diagnosis not present

## 2023-12-18 NOTE — Telephone Encounter (Signed)
 Patients daughter (DPR) notified that pt does not need additional lab work drawn prior to her appointment on 7/2 per providers recommendations. Daughter verbalized understanding and had no further questions or concerns at this time.

## 2023-12-18 NOTE — Telephone Encounter (Signed)
 Pt's daughter came into the office and stated that her mother the pt will be seen next week here in [place], she is transferring from [place]. The pt is supposed to have a BMET before her appt and the daughter is wanting to know if she still needs to get it done. She was just seen last week in the ER and they drew a lot of blood. It is hard to get blood drawn from the pt and she is just wanting to know if they should get it drawn. She said tomorrow is the only day they will be able to come before next week. Please call pt's daughter [phone].

## 2023-12-18 NOTE — Telephone Encounter (Signed)
 Pt had BMET drawn 6/18. Please advise if pt needs to have lab redrawn.

## 2023-12-21 NOTE — Progress Notes (Unsigned)
 Cardiology Office Note    Date:  12/24/2023  ID:  [name], [name] [birth year], MRN [id number] Cardiologist: Aleene Passe, MD  --> Requests to follow-up in [facility] given Dr. Allena retirement  History of Present Illness:    [name] is a 89 y.o. female with past medical history of HTN, HLD, SVT and CAD (calcium  score 490 by CT in 04/2020) who presents to the office today for 70-month follow-up.  She was examined by Dr. Passe on 09/12/2023 and had been under more stress as her husband had recently passed away. BP was above goal and Valsartan  was titrated from 40 mg daily to 80 mg daily.  In the interim, she was admitted to [facility] from 6/17 - 12/10/2023 for evaluation after a fall and questionable syncopal episode. Was felt to be dehydrated on admission and was initially hyponatremic and received IV fluids with improvement of sodium to 136 at the time of discharge. CT Head showed no acute intracranial abnormalities. Given her dehydration, Spironolactone  was stopped and Valsartan  was reduced back to 40 mg daily.  In talking with the patient and her daughter today, she reports overall doing well given her age. She lives by herself but family members check on her routinely. She does routine housework and also works in the yard and denies any symptoms with this. No recent chest pain or dyspnea on exertion. No recent palpitations, orthopnea, PND or pitting edema. She has been trying to increase her fluid intake but only consumes approximately 1 glass of water a day along with a few glasses of tea. Reports still having episodic dizziness but this has improved since dose reduction of Valsartan . Her daughter reports she is very sensitive to medication changes. She is no longer on statin therapy as this was discontinued by her PCP due to worsening short-term memory issues.  Studies Reviewed:   EKG: EKG is not ordered today. EKG from 12/09/2023 is reviewed and shows NSR, HR  70 with slight TWI along the anterolateral leads and noted on prior tracings.   Coronary Calcium  Score: 04/2020 IMPRESSION: Coronary calcium  score of 490. This was 76th percentile for age and sex matched control.  Event Monitor: 02/2022   Underlying rhythm is normal sinus rhythm   Rare PVC   Occasional bradycardia ( in early morning , likely while sleeping )   No serious arrhythmias observed     Patch Wear Time:  13 days and 22 hours (2023-08-29T20:31:26-0400 to 2023-09-12T19:29:37-0400)   Patient had a min HR of 27 bpm, max HR of 144 bpm, and avg HR of 62 bpm. Predominant underlying rhythm was Sinus Rhythm. 1 run of Supraventricular Tachycardia occurred lasting 7 beats with a max rate of 105 bpm (avg 91 bpm). Second Degree AV Block-Mobitz  I (Wenckebach) was present. Isolated SVEs were rare (<1.0%), SVE Couplets were rare (<1.0%), and SVE Triplets were rare (<1.0%). Isolated VEs were rare (<1.0%), VE Couplets were rare (<1.0%), and no VE Triplets were present. Ventricular Bigeminy was  present.   Carotid Dopplers: 05/2022 MPRESSION: Right:   Color duplex indicates minimal heterogeneous plaque, with no hemodynamically significant stenosis by duplex criteria in the extracranial cerebrovascular circulation.   Left:   Heterogeneous plaque at the left carotid bifurcation, with discordant results regarding degree of stenosis by established duplex criteria. Peak velocity suggests 50%-69% stenosis, with the ICA/ CCA ratio suggesting a lesser degree of stenosis. If establishing a more accurate degree of stenosis is required, cerebral angiogram should be considered, or  as a second best test, CTA.   Physical Exam:   VS:  BP 120/80   Pulse 73   Ht 4' 11 (1.499 m)   Wt 112 lb (50.8 kg)   SpO2 92%   BMI 22.62 kg/m    Wt Readings from Last 3 Encounters:  12/24/23 112 lb (50.8 kg)  12/10/23 111 lb 15.9 oz (50.8 kg)  09/12/23 113 lb (51.3 kg)     GEN: Pleasant, elderly female  appearing in no acute distress NECK: No JVD; No carotid bruits CARDIAC: RRR, no murmurs, rubs, gallops RESPIRATORY:  Clear to auscultation without rales, wheezing or rhonchi  ABDOMEN: Appears non-distended. No obvious abdominal masses. EXTREMITIES: No clubbing or cyanosis. No pitting edema.  Distal pedal pulses are 2+ bilaterally.   Assessment and Plan:   1. Coronary artery calcification seen on CT scan - She remains active given her age and denies any recent anginal symptoms. She was previously on statin therapy but this was discontinued by her PCP given worsening memory issues.  2. PSVT (paroxysmal supraventricular tachycardia) (HCC) - No recent palpitations. She was previously on Toprol -XL but discontinued due to worsening dizziness.  3. Essential hypertension - BP is at 120/80 during today's visit but was slightly higher on recheck. They do hold her Valsartan  at times due to variable blood pressure. Due to the variability of this, I recommended reducing Valsartan  to 20 mg daily and have asked them to keep a BP log and return this in approximately 1 month for review.  4. Hyperlipidemia LDL goal <70 - LDL was at 863 when checked in 08/2023. Was previously on Crestor  but reports being intolerant to multiple statins in the past and this was actually discontinued by her PCP in the interim due to worsening memory issues. Encouraged her to focus on consuming a heart healthy diet.   Signed, Laymon CHRISTELLA Qua, PA-C

## 2023-12-24 ENCOUNTER — Encounter: Payer: Self-pay | Admitting: Student

## 2023-12-24 ENCOUNTER — Ambulatory Visit: Attending: Student | Admitting: Student

## 2023-12-24 VITALS — BP 120/80 | HR 73 | Ht 59.0 in | Wt 112.0 lb

## 2023-12-24 DIAGNOSIS — C4491 Basal cell carcinoma of skin, unspecified: Secondary | ICD-10-CM | POA: Diagnosis not present

## 2023-12-24 DIAGNOSIS — I6603 Occlusion and stenosis of bilateral middle cerebral arteries: Secondary | ICD-10-CM | POA: Diagnosis not present

## 2023-12-24 DIAGNOSIS — E785 Hyperlipidemia, unspecified: Secondary | ICD-10-CM | POA: Diagnosis not present

## 2023-12-24 DIAGNOSIS — I10 Essential (primary) hypertension: Secondary | ICD-10-CM

## 2023-12-24 DIAGNOSIS — E876 Hypokalemia: Secondary | ICD-10-CM | POA: Diagnosis not present

## 2023-12-24 DIAGNOSIS — K5790 Diverticulosis of intestine, part unspecified, without perforation or abscess without bleeding: Secondary | ICD-10-CM | POA: Diagnosis not present

## 2023-12-24 DIAGNOSIS — I2510 Atherosclerotic heart disease of native coronary artery without angina pectoris: Secondary | ICD-10-CM | POA: Diagnosis not present

## 2023-12-24 DIAGNOSIS — M47812 Spondylosis without myelopathy or radiculopathy, cervical region: Secondary | ICD-10-CM | POA: Diagnosis not present

## 2023-12-24 DIAGNOSIS — I951 Orthostatic hypotension: Secondary | ICD-10-CM | POA: Diagnosis not present

## 2023-12-24 DIAGNOSIS — K219 Gastro-esophageal reflux disease without esophagitis: Secondary | ICD-10-CM | POA: Diagnosis not present

## 2023-12-24 DIAGNOSIS — Z556 Problems related to health literacy: Secondary | ICD-10-CM | POA: Diagnosis not present

## 2023-12-24 DIAGNOSIS — Z9181 History of falling: Secondary | ICD-10-CM | POA: Diagnosis not present

## 2023-12-24 DIAGNOSIS — I471 Supraventricular tachycardia, unspecified: Secondary | ICD-10-CM | POA: Diagnosis not present

## 2023-12-24 DIAGNOSIS — Z9089 Acquired absence of other organs: Secondary | ICD-10-CM | POA: Diagnosis not present

## 2023-12-24 DIAGNOSIS — M8550 Aneurysmal bone cyst, unspecified site: Secondary | ICD-10-CM | POA: Diagnosis not present

## 2023-12-24 MED ORDER — VALSARTAN 40 MG PO TABS: 20.0000 mg | ORAL_TABLET | Freq: Every day | ORAL | 3 refills | Status: AC

## 2023-12-24 NOTE — Patient Instructions (Signed)
 Medication Instructions:  Your physician has recommended you make the following change in your medication:   Take Valsartan  20 mg Daily   *If you need a refill on your cardiac medications before your next appointment, please call your pharmacy*  Lab Work: NONE   If you have labs (blood work) drawn today and your tests are completely normal, you will receive your results only by: MyChart Message (if you have MyChart) OR A paper copy in the mail If you have any lab test that is abnormal or we need to change your treatment, we will call you to review the results.  Testing/Procedures: NONE   Follow-Up: At [facility], you and your health needs are our priority.  As part of our continuing mission to provide you with exceptional heart care, our providers are all part of one team.  This team includes your primary Cardiologist (physician) and Advanced Practice Providers or APPs (Physician Assistants and Nurse Practitioners) who all work together to provide you with the care you need, when you need it.  Your next appointment:   6 month(s)  Provider:   Vishnu Mallipeddi, MD    We recommend signing up for the patient portal called MyChart.  Sign up information is provided on this After Visit Summary.  MyChart is used to connect with patients for Virtual Visits (Telemedicine).  Patients are able to view lab/test results, encounter notes, upcoming appointments, etc.  Non-urgent messages can be sent to your provider as well.   To learn more about what you can do with MyChart, go to [email or web address].   Other Instructions Thank you for choosing [facility]!

## 2023-12-25 DIAGNOSIS — Z9089 Acquired absence of other organs: Secondary | ICD-10-CM | POA: Diagnosis not present

## 2023-12-25 DIAGNOSIS — K219 Gastro-esophageal reflux disease without esophagitis: Secondary | ICD-10-CM | POA: Diagnosis not present

## 2023-12-25 DIAGNOSIS — I471 Supraventricular tachycardia, unspecified: Secondary | ICD-10-CM | POA: Diagnosis not present

## 2023-12-25 DIAGNOSIS — Z556 Problems related to health literacy: Secondary | ICD-10-CM | POA: Diagnosis not present

## 2023-12-25 DIAGNOSIS — K5790 Diverticulosis of intestine, part unspecified, without perforation or abscess without bleeding: Secondary | ICD-10-CM | POA: Diagnosis not present

## 2023-12-25 DIAGNOSIS — M8550 Aneurysmal bone cyst, unspecified site: Secondary | ICD-10-CM | POA: Diagnosis not present

## 2023-12-25 DIAGNOSIS — I951 Orthostatic hypotension: Secondary | ICD-10-CM | POA: Diagnosis not present

## 2023-12-25 DIAGNOSIS — M47812 Spondylosis without myelopathy or radiculopathy, cervical region: Secondary | ICD-10-CM | POA: Diagnosis not present

## 2023-12-25 DIAGNOSIS — I6603 Occlusion and stenosis of bilateral middle cerebral arteries: Secondary | ICD-10-CM | POA: Diagnosis not present

## 2023-12-25 DIAGNOSIS — E876 Hypokalemia: Secondary | ICD-10-CM | POA: Diagnosis not present

## 2023-12-25 DIAGNOSIS — C4491 Basal cell carcinoma of skin, unspecified: Secondary | ICD-10-CM | POA: Diagnosis not present

## 2023-12-25 DIAGNOSIS — Z9181 History of falling: Secondary | ICD-10-CM | POA: Diagnosis not present

## 2023-12-25 DIAGNOSIS — I10 Essential (primary) hypertension: Secondary | ICD-10-CM | POA: Diagnosis not present

## 2023-12-31 DIAGNOSIS — K219 Gastro-esophageal reflux disease without esophagitis: Secondary | ICD-10-CM | POA: Diagnosis not present

## 2023-12-31 DIAGNOSIS — I951 Orthostatic hypotension: Secondary | ICD-10-CM | POA: Diagnosis not present

## 2023-12-31 DIAGNOSIS — I10 Essential (primary) hypertension: Secondary | ICD-10-CM | POA: Diagnosis not present

## 2023-12-31 DIAGNOSIS — K5790 Diverticulosis of intestine, part unspecified, without perforation or abscess without bleeding: Secondary | ICD-10-CM | POA: Diagnosis not present

## 2023-12-31 DIAGNOSIS — C4491 Basal cell carcinoma of skin, unspecified: Secondary | ICD-10-CM | POA: Diagnosis not present

## 2023-12-31 DIAGNOSIS — E876 Hypokalemia: Secondary | ICD-10-CM | POA: Diagnosis not present

## 2023-12-31 DIAGNOSIS — I6603 Occlusion and stenosis of bilateral middle cerebral arteries: Secondary | ICD-10-CM | POA: Diagnosis not present

## 2023-12-31 DIAGNOSIS — Z9181 History of falling: Secondary | ICD-10-CM | POA: Diagnosis not present

## 2023-12-31 DIAGNOSIS — Z9089 Acquired absence of other organs: Secondary | ICD-10-CM | POA: Diagnosis not present

## 2023-12-31 DIAGNOSIS — M8550 Aneurysmal bone cyst, unspecified site: Secondary | ICD-10-CM | POA: Diagnosis not present

## 2023-12-31 DIAGNOSIS — M47812 Spondylosis without myelopathy or radiculopathy, cervical region: Secondary | ICD-10-CM | POA: Diagnosis not present

## 2023-12-31 DIAGNOSIS — Z556 Problems related to health literacy: Secondary | ICD-10-CM | POA: Diagnosis not present

## 2023-12-31 DIAGNOSIS — I471 Supraventricular tachycardia, unspecified: Secondary | ICD-10-CM | POA: Diagnosis not present

## 2024-02-10 DIAGNOSIS — E876 Hypokalemia: Secondary | ICD-10-CM | POA: Diagnosis not present

## 2024-02-10 DIAGNOSIS — C4491 Basal cell carcinoma of skin, unspecified: Secondary | ICD-10-CM | POA: Diagnosis not present

## 2024-02-10 DIAGNOSIS — I471 Supraventricular tachycardia, unspecified: Secondary | ICD-10-CM | POA: Diagnosis not present

## 2024-02-10 DIAGNOSIS — Z556 Problems related to health literacy: Secondary | ICD-10-CM | POA: Diagnosis not present

## 2024-02-10 DIAGNOSIS — M8550 Aneurysmal bone cyst, unspecified site: Secondary | ICD-10-CM | POA: Diagnosis not present

## 2024-02-10 DIAGNOSIS — Z9181 History of falling: Secondary | ICD-10-CM | POA: Diagnosis not present

## 2024-02-10 DIAGNOSIS — K5790 Diverticulosis of intestine, part unspecified, without perforation or abscess without bleeding: Secondary | ICD-10-CM | POA: Diagnosis not present

## 2024-02-10 DIAGNOSIS — I10 Essential (primary) hypertension: Secondary | ICD-10-CM | POA: Diagnosis not present

## 2024-02-10 DIAGNOSIS — I6603 Occlusion and stenosis of bilateral middle cerebral arteries: Secondary | ICD-10-CM | POA: Diagnosis not present

## 2024-02-10 DIAGNOSIS — Z9089 Acquired absence of other organs: Secondary | ICD-10-CM | POA: Diagnosis not present

## 2024-02-10 DIAGNOSIS — I951 Orthostatic hypotension: Secondary | ICD-10-CM | POA: Diagnosis not present

## 2024-02-10 DIAGNOSIS — K219 Gastro-esophageal reflux disease without esophagitis: Secondary | ICD-10-CM | POA: Diagnosis not present

## 2024-02-10 DIAGNOSIS — M47812 Spondylosis without myelopathy or radiculopathy, cervical region: Secondary | ICD-10-CM | POA: Diagnosis not present

## 2024-02-25 ENCOUNTER — Emergency Department (HOSPITAL_COMMUNITY)

## 2024-02-25 ENCOUNTER — Encounter (HOSPITAL_COMMUNITY): Payer: Self-pay

## 2024-02-25 ENCOUNTER — Other Ambulatory Visit: Payer: Self-pay

## 2024-02-25 ENCOUNTER — Observation Stay (HOSPITAL_COMMUNITY)
Admission: EM | Admit: 2024-02-25 | Discharge: 2024-02-26 | Disposition: A | Discharge status: Home / Self Care | Attending: Internal Medicine | Admitting: Internal Medicine

## 2024-02-25 DIAGNOSIS — N3090 Cystitis, unspecified without hematuria: Secondary | ICD-10-CM

## 2024-02-25 DIAGNOSIS — G9340 Encephalopathy, unspecified: Secondary | ICD-10-CM

## 2024-02-25 DIAGNOSIS — R1110 Vomiting, unspecified: Secondary | ICD-10-CM | POA: Diagnosis present

## 2024-02-25 DIAGNOSIS — Z743 Need for continuous supervision: Secondary | ICD-10-CM | POA: Diagnosis not present

## 2024-02-25 DIAGNOSIS — E876 Hypokalemia: Secondary | ICD-10-CM | POA: Insufficient documentation

## 2024-02-25 DIAGNOSIS — R1111 Vomiting without nausea: Secondary | ICD-10-CM | POA: Diagnosis not present

## 2024-02-25 DIAGNOSIS — M6281 Muscle weakness (generalized): Secondary | ICD-10-CM | POA: Diagnosis not present

## 2024-02-25 DIAGNOSIS — E860 Dehydration: Secondary | ICD-10-CM | POA: Diagnosis not present

## 2024-02-25 DIAGNOSIS — Z79899 Other long term (current) drug therapy: Secondary | ICD-10-CM | POA: Insufficient documentation

## 2024-02-25 DIAGNOSIS — R531 Weakness: Secondary | ICD-10-CM | POA: Insufficient documentation

## 2024-02-25 DIAGNOSIS — I10 Essential (primary) hypertension: Secondary | ICD-10-CM | POA: Diagnosis not present

## 2024-02-25 DIAGNOSIS — M8580 Other specified disorders of bone density and structure, unspecified site: Secondary | ICD-10-CM | POA: Insufficient documentation

## 2024-02-25 DIAGNOSIS — R112 Nausea with vomiting, unspecified: Principal | ICD-10-CM | POA: Insufficient documentation

## 2024-02-25 DIAGNOSIS — R197 Diarrhea, unspecified: Principal | ICD-10-CM | POA: Insufficient documentation

## 2024-02-25 DIAGNOSIS — R110 Nausea: Secondary | ICD-10-CM

## 2024-02-25 DIAGNOSIS — R748 Abnormal levels of other serum enzymes: Secondary | ICD-10-CM | POA: Diagnosis not present

## 2024-02-25 DIAGNOSIS — R8281 Pyuria: Secondary | ICD-10-CM | POA: Diagnosis not present

## 2024-02-25 DIAGNOSIS — G9341 Metabolic encephalopathy: Secondary | ICD-10-CM | POA: Insufficient documentation

## 2024-02-25 DIAGNOSIS — R55 Syncope and collapse: Principal | ICD-10-CM

## 2024-02-25 LAB — CBC
HCT: 42.5 % (ref 36.0–46.0)
Hemoglobin: 13.9 g/dL (ref 12.0–15.0)
MCH: 30.8 pg (ref 26.0–34.0)
MCHC: 32.7 g/dL (ref 30.0–36.0)
MCV: 94.2 fL (ref 80.0–100.0)
Platelets: 257 K/uL (ref 150–400)
RBC: 4.51 MIL/uL (ref 3.87–5.11)
RDW: 13.5 % (ref 11.5–15.5)
WBC: 8 K/uL (ref 4.0–10.5)
nRBC: 0 % (ref 0.0–0.2)

## 2024-02-25 LAB — COMPREHENSIVE METABOLIC PANEL WITH GFR
ALT: 20 U/L (ref 0–44)
AST: 27 U/L (ref 15–41)
Albumin: 3.9 g/dL (ref 3.5–5.0)
Alkaline Phosphatase: 79 U/L (ref 38–126)
Anion gap: 15 (ref 5–15)
BUN: 18 mg/dL (ref 8–23)
CO2: 19 mmol/L — ABNORMAL LOW (ref 22–32)
Calcium: 8.8 mg/dL — ABNORMAL LOW (ref 8.9–10.3)
Chloride: 101 mmol/L (ref 98–111)
Creatinine, Ser: 0.93 mg/dL (ref 0.44–1.00)
GFR, Estimated: 59 mL/min — ABNORMAL LOW (ref >60)
Glucose, Bld: 105 mg/dL — ABNORMAL HIGH (ref 70–99)
Potassium: 3.3 mmol/L — ABNORMAL LOW (ref 3.5–5.1)
Sodium: 135 mmol/L (ref 135–145)
Total Bilirubin: 0.6 mg/dL (ref 0.0–1.2)
Total Protein: 7.5 g/dL (ref 6.5–8.1)

## 2024-02-25 LAB — LIPASE, BLOOD: Lipase: 61 U/L — ABNORMAL HIGH (ref 11–51)

## 2024-02-25 LAB — RESP PANEL BY RT-PCR (RSV, FLU A&B, COVID)  RVPGX2
Influenza A by PCR: NEGATIVE
Influenza B by PCR: NEGATIVE
Resp Syncytial Virus by PCR: NEGATIVE
SARS Coronavirus 2 by RT PCR: NEGATIVE

## 2024-02-25 MED ORDER — SODIUM CHLORIDE 0.9 % IV BOLUS
1000.0000 mL | Freq: Once | INTRAVENOUS | Status: AC
  Administered 2024-02-25: 1000 mL via INTRAVENOUS

## 2024-02-25 MED ORDER — DICYCLOMINE HCL 10 MG/ML IM SOLN
10.0000 mg | Freq: Once | INTRAMUSCULAR | Status: AC
  Administered 2024-02-25: 10 mg via INTRAMUSCULAR
  Filled 2024-02-25: qty 2

## 2024-02-25 MED ORDER — ONDANSETRON HCL 4 MG/2ML IJ SOLN
4.0000 mg | Freq: Once | INTRAMUSCULAR | Status: AC | PRN
  Administered 2024-02-25: 4 mg via INTRAVENOUS
  Filled 2024-02-25: qty 2

## 2024-02-25 NOTE — ED Notes (Signed)
 Pt now says she has to urinate- bedside toilet brought to pt room

## 2024-02-25 NOTE — ED Notes (Signed)
 Pt taken to restroom via wheelchair; unable to urinate at this time.

## 2024-02-25 NOTE — ED Triage Notes (Signed)
 Rcems from home when patient started suddenly vomiting and having diarrhea tonight.  Did not take bp meds this am. 150/88 87hr rr12 o2 97% ra.  Slightly altered at baseline

## 2024-02-25 NOTE — ED Provider Notes (Signed)
 [place] EMERGENCY DEPARTMENT AT [facility] Provider Note   CSN: [id number] Arrival date & time: 02/25/24  1929     Patient presents with: Emesis   [name] is a 89 y.o. female presenting from home with concern for sudden onset nausea, vomiting, diarrhea, weakness, paleness, while having dinner this evening.  Patient is here with her daughters at the bedside.  They report the patient been complaining of a headache, mild and persistent for the past several days.  The rest of her symptoms began today.  The daughters in private expressed concern that the patient has abruptly seemed confused after this accident cannot recall what happened.  They stated the patient seemed very lightheaded at the time.  Also pale.  One daughter ate the same thing it was no one was sick.  No sick contacts in the house.  They did a negative home COVID and flu test this weekend.  They report to me the patient lives independently, has some mild memory problems but no significant dementia problems.   HPI     Prior to Admission medications   Medication Sig Start Date End Date Taking? Authorizing Provider  aspirin EC 81 MG tablet Take 81 mg by mouth daily. Swallow whole.    [provider]  Calcium  Carbonate Antacid (CALCIUM  CARBONATE PO) Take 1 tablet by mouth daily.    [provider]  Cholecalciferol (VITAMIN D3) 125 MCG (5000 UT) CAPS Take 1 capsule by mouth daily.    [provider]  Menaquinone-7 (VITAMIN K2 PO) Take 1 tablet by mouth daily.    [provider]  Multiple Vitamin (MULTIVITAMIN) tablet Take 1 tablet by mouth daily.    [provider]  valsartan  (DIOVAN ) 40 MG tablet Take 0.5 tablets (20 mg total) by mouth daily. 12/24/23 12/23/24  Johnson Laymon HERO, PA-C    Allergies: Glucose, Morphine , Sulfonamide derivatives, Vancomycin, Wheat, and Penicillins    Review of Systems  Updated Vital Signs BP 137/76   Pulse 70   Temp 97.9 F (36.6  C) (Oral)   Resp 17   Ht 4' 11 (1.499 m)   Wt 50.8 kg   SpO2 96%   BMI 22.62 kg/m   Physical Exam Constitutional:      General: She is not in acute distress. HENT:     Head: Normocephalic and atraumatic.  Eyes:     Conjunctiva/sclera: Conjunctivae normal.     Pupils: Pupils are equal, round, and reactive to light.  Cardiovascular:     Rate and Rhythm: Normal rate and regular rhythm.  Pulmonary:     Effort: Pulmonary effort is normal. No respiratory distress.  Abdominal:     General: There is no distension.     Tenderness: There is no abdominal tenderness.  Skin:    General: Skin is warm and dry.  Neurological:     General: No focal deficit present.     Mental Status: She is alert. Mental status is at baseline.     (all labs ordered are listed, but only abnormal results are displayed) Labs Reviewed  LIPASE, BLOOD - Abnormal; Notable for the following components:      Result Value   Lipase 61 (*)    All other components within normal limits  COMPREHENSIVE METABOLIC PANEL WITH GFR - Abnormal; Notable for the following components:   Potassium 3.3 (*)    CO2 19 (*)    Glucose, Bld 105 (*)    Calcium  8.8 (*)    GFR,  Estimated 59 (*)    All other components within normal limits  RESP PANEL BY RT-PCR (RSV, FLU A&B, COVID)  [id number]  C DIFFICILE QUICK SCREEN W PCR REFLEX    CBC  URINALYSIS, ROUTINE W REFLEX MICROSCOPIC  TROPONIN I (HIGH SENSITIVITY)  TROPONIN I (HIGH SENSITIVITY)    EKG: EKG Interpretation Date/Time:  Wednesday February 25 2024 19:47:33 EDT Ventricular Rate:  79 PR Interval:  154 QRS Duration:  98 QT Interval:  386 QTC Calculation: 442 R Axis:   42  Text Interpretation: Normal sinus rhythm When compared with ECG of 09-Dec-2023 14:26, PREVIOUS ECG IS PRESENT Confirmed by Cottie Cough [phone]) on 02/25/2024 9:41:22 PM  Radiology: CT Head Wo Contrast Result Date: 02/25/2024 EXAM: CT HEAD WITHOUT CONTRAST 02/25/2024 10:29:03 PM TECHNIQUE: CT of  the head was performed without the administration of intravenous contrast. Automated exposure control, iterative reconstruction, and/or weight based adjustment of the mA/kV was utilized to reduce the radiation dose to as low as reasonably achievable. COMPARISON: 12/09/2023 CLINICAL HISTORY: Mental status change, unknown cause. Rcems from home when patient started suddenly vomiting and having diarrhea tonight. Did not take bp meds this am. 150/88 87hr rr12 o2 97% ra. Slightly altered at baseline. FINDINGS: BRAIN AND VENTRICLES: No acute hemorrhage. No evidence of acute infarct. No hydrocephalus. No extra-axial collection. No mass effect or midline shift. Chronic ischemic white matter changes and mild volume loss. ORBITS: No acute abnormality. SINUSES: No acute abnormality. SOFT TISSUES AND SKULL: No acute soft tissue abnormality. No skull fracture. IMPRESSION: 1. No acute intracranial abnormality. 2. Chronic ischemic white matter changes and mild volume loss. Electronically signed by: Franky Stanford MD 02/25/2024 10:53 PM EDT RP Workstation: [id number]     Procedures   Medications Ordered in the ED  ondansetron  (ZOFRAN ) injection 4 mg (4 mg Intravenous Given 02/25/24 2004)  sodium chloride  0.9 % bolus 1,000 mL (1,000 mLs Intravenous New Bag/Given 02/25/24 2208)  dicyclomine  (BENTYL ) injection 10 mg (10 mg Intramuscular Given 02/25/24 2239)                                    Medical Decision Making Amount and/or Complexity of Data Reviewed Labs: ordered. Radiology: ordered.  Risk Prescription drug management.   This patient presents to the ED with concern for nausea, vomiting, diarrhea, altered mental status. This involves an extensive number of treatment options, and is a complaint that carries with it a high risk of complications and morbidity.  The differential diagnosis includes viral illness versus metabolic derangement versus CVA versus other   Additional history obtained from patient's family  bedside  I ordered and personally interpreted labs.  The pertinent results include: Mild hypokalemia.  No emergent findings.  UA and troponin pending  I ordered imaging studies including CT scan of the head I independently visualized and interpreted imaging which showed no emergent findings I agree with the radiologist interpretation  The patient was maintained on a cardiac monitor.  I personally viewed and interpreted the cardiac monitored which showed an underlying rhythm of: Sinus rhythm  Per my interpretation the patient's ECG shows normal sinus rhythm no acute ischemia  I ordered medication including IV fluids, Bentyl , Zofran   I have reviewed the patients home medicines and have made adjustments as needed  Test Considered: Low suspicion for acute PE, meningitis, sepsis  After the interventions noted above, I reevaluated the patient and found that they have: improved  Disposition:  After consideration of the diagnostic results and the patients response to treatment, I feel that the patent would benefit from admission  Patient signed out to Dr Roselyn pending troponin and likely admission.      Final diagnoses:  None    ED Discharge Orders     None          Cottie Donnice PARAS, MD 02/25/24 2336

## 2024-02-26 DIAGNOSIS — G9341 Metabolic encephalopathy: Secondary | ICD-10-CM

## 2024-02-26 DIAGNOSIS — N3090 Cystitis, unspecified without hematuria: Secondary | ICD-10-CM

## 2024-02-26 DIAGNOSIS — I10 Essential (primary) hypertension: Secondary | ICD-10-CM

## 2024-02-26 DIAGNOSIS — R110 Nausea: Secondary | ICD-10-CM

## 2024-02-26 DIAGNOSIS — E860 Dehydration: Secondary | ICD-10-CM | POA: Insufficient documentation

## 2024-02-26 DIAGNOSIS — R748 Abnormal levels of other serum enzymes: Secondary | ICD-10-CM | POA: Diagnosis not present

## 2024-02-26 DIAGNOSIS — R531 Weakness: Secondary | ICD-10-CM | POA: Diagnosis not present

## 2024-02-26 DIAGNOSIS — R197 Diarrhea, unspecified: Secondary | ICD-10-CM | POA: Insufficient documentation

## 2024-02-26 DIAGNOSIS — E876 Hypokalemia: Secondary | ICD-10-CM | POA: Insufficient documentation

## 2024-02-26 DIAGNOSIS — M8580 Other specified disorders of bone density and structure, unspecified site: Secondary | ICD-10-CM | POA: Diagnosis not present

## 2024-02-26 LAB — GASTROINTESTINAL PANEL BY PCR, STOOL (REPLACES STOOL CULTURE)

## 2024-02-26 LAB — CBC
HCT: 37.8 % (ref 36.0–46.0)
Hemoglobin: 12.2 g/dL (ref 12.0–15.0)
MCH: 31 pg (ref 26.0–34.0)
MCHC: 32.3 g/dL (ref 30.0–36.0)
MCV: 95.9 fL (ref 80.0–100.0)
Platelets: 227 K/uL (ref 150–400)
RBC: 3.94 MIL/uL (ref 3.87–5.11)
RDW: 13.7 % (ref 11.5–15.5)
WBC: 7.2 K/uL (ref 4.0–10.5)
nRBC: 0 % (ref 0.0–0.2)

## 2024-02-26 LAB — COMPREHENSIVE METABOLIC PANEL WITH GFR
ALT: 18 U/L (ref 0–44)
AST: 24 U/L (ref 15–41)
Albumin: 3.5 g/dL (ref 3.5–5.0)
Alkaline Phosphatase: 67 U/L (ref 38–126)
Anion gap: 9 (ref 5–15)
BUN: 14 mg/dL (ref 8–23)
CO2: 25 mmol/L (ref 22–32)
Calcium: 8.2 mg/dL — ABNORMAL LOW (ref 8.9–10.3)
Chloride: 107 mmol/L (ref 98–111)
Creatinine, Ser: 0.68 mg/dL (ref 0.44–1.00)
GFR, Estimated: >60 mL/min (ref >60)
Glucose, Bld: 92 mg/dL (ref 70–99)
Potassium: 3.8 mmol/L (ref 3.5–5.1)
Sodium: 141 mmol/L (ref 135–145)
Total Bilirubin: 0.7 mg/dL (ref 0.0–1.2)
Total Protein: 6.3 g/dL — ABNORMAL LOW (ref 6.5–8.1)

## 2024-02-26 LAB — URINALYSIS, ROUTINE W REFLEX MICROSCOPIC
Bilirubin Urine: NEGATIVE
Glucose, UA: NEGATIVE mg/dL
Hgb urine dipstick: NEGATIVE
Ketones, ur: NEGATIVE mg/dL
Nitrite: NEGATIVE
Protein, ur: NEGATIVE mg/dL
Specific Gravity, Urine: 1.008 (ref 1.005–1.030)
pH: 6 (ref 5.0–8.0)

## 2024-02-26 LAB — T4, FREE: Free T4: 0.98 ng/dL (ref 0.61–1.12)

## 2024-02-26 LAB — C DIFFICILE QUICK SCREEN W PCR REFLEX
C Diff antigen: NEGATIVE
C Diff interpretation: NOT DETECTED
C Diff toxin: NEGATIVE

## 2024-02-26 LAB — TSH: TSH: 1.855 u[IU]/mL (ref 0.350–4.500)

## 2024-02-26 LAB — TROPONIN I (HIGH SENSITIVITY)
Troponin I (High Sensitivity): 6 ng/L (ref <18)
Troponin I (High Sensitivity): 5 ng/L (ref <18)

## 2024-02-26 LAB — FOLATE: Folate: >20 ng/mL (ref >5.9)

## 2024-02-26 LAB — PHOSPHORUS: Phosphorus: 2.8 mg/dL (ref 2.5–4.6)

## 2024-02-26 LAB — MAGNESIUM: Magnesium: 2.2 mg/dL (ref 1.7–2.4)

## 2024-02-26 LAB — VITAMIN B12: Vitamin B-12: 4066 pg/mL — ABNORMAL HIGH (ref 180–914)

## 2024-02-26 MED ORDER — CEPHALEXIN 500 MG PO CAPS: 500.0000 mg | ORAL_CAPSULE | Freq: Three times a day (TID) | ORAL | 0 refills | Status: DC

## 2024-02-26 MED ORDER — ONDANSETRON HCL 4 MG PO TABS: 4.0000 mg | ORAL_TABLET | Freq: Four times a day (QID) | ORAL | Status: DC | PRN

## 2024-02-26 MED ORDER — ENOXAPARIN SODIUM 30 MG/0.3ML IJ SOSY
30.0000 mg | PREFILLED_SYRINGE | INTRAMUSCULAR | Status: DC
Start: 2024-02-26 — End: 2024-02-26
  Administered 2024-02-26: 30 mg via SUBCUTANEOUS
  Filled 2024-02-26: qty 0.3

## 2024-02-26 MED ORDER — LACTATED RINGERS IV BOLUS
1000.0000 mL | Freq: Once | INTRAVENOUS | Status: AC
  Administered 2024-02-26: 1000 mL via INTRAVENOUS

## 2024-02-26 MED ORDER — LACTATED RINGERS IV SOLN: INTRAVENOUS | Status: AC

## 2024-02-26 MED ORDER — POTASSIUM CHLORIDE 10 MEQ/100ML IV SOLN
10.0000 meq | INTRAVENOUS | Status: AC
  Administered 2024-02-26 (×2): 10 meq via INTRAVENOUS
  Filled 2024-02-26 (×2): qty 100

## 2024-02-26 MED ORDER — ACETAMINOPHEN 325 MG PO TABS: 650.0000 mg | ORAL_TABLET | Freq: Four times a day (QID) | ORAL | Status: DC | PRN

## 2024-02-26 MED ORDER — ONDANSETRON HCL 4 MG/2ML IJ SOLN: 4.0000 mg | Freq: Four times a day (QID) | INTRAMUSCULAR | Status: DC | PRN

## 2024-02-26 MED ORDER — ACETAMINOPHEN 650 MG RE SUPP: 650.0000 mg | Freq: Four times a day (QID) | RECTAL | Status: DC | PRN

## 2024-02-26 MED ORDER — CEPHALEXIN 500 MG PO CAPS
500.0000 mg | ORAL_CAPSULE | Freq: Three times a day (TID) | ORAL | Status: DC
  Administered 2024-02-26: 500 mg via ORAL
  Filled 2024-02-26: qty 1

## 2024-02-26 NOTE — Evaluation (Signed)
 Physical Therapy Evaluation Patient Details Name: [name] MRN: [id number] DOB: 1934-12-07 Today's Date: 02/26/2024  History of Present Illness  Per FI:[name] is a 89 y.o. female with medical history significant of hypertension, osteopenia who presents to the emergency department from home via EMS due to sudden onset of nausea and diarrhea which started while having dinner yesterday.  Patient was unable to provide history due to underlying short-term memory problems, most of the history was obtained from daughter at bedside.  Per report, patient suddenly got up from the dining table while having dinner with daughter and rushed to the bathroom due to nausea (no vomiting) and this was followed by several episodes of watery diarrhea with subsequent weakness and confusion.  She was pale and complained of lightheadedness, but there was no syncope.  COVID and flu test done over the weekend was negative.  Clinical Impression  Pt is at prior functional level.  States in the AM she will use a cane until she has been walking a little bit.  PT slightly unsteady without assistive device, with RW, which pt has at home pt is Mod I with walking.  Has bedroom and bath on first floor.  Children have all [place] houses [place] so she has plenty of help.  No issues noted.          If plan is discharge home, recommend the following: Assistance with cooking/housework;Assist for transportation   Can travel by private vehicle    yes    Equipment Recommendations None recommended by PT  Recommendations for Other Services    none   Functional Status Assessment Patient has not had a recent decline in their functional status     Precautions / Restrictions Precautions Precautions: None Recall of Precautions/Restrictions: Intact Restrictions Weight Bearing Restrictions Per Provider Order: No      Mobility  Bed Mobility Overal bed mobility: Modified Independent                   Transfers Overall transfer level: Modified independent Equipment used: Rolling walker (2 wheels)                    Ambulation/Gait Ambulation/Gait assistance: Modified independent (Device/Increase time) Gait Distance (Feet): 200 Feet Assistive device: Rolling walker (2 wheels) Gait Pattern/deviations: WFL(Within Functional Limits)   Gait velocity interpretation: 1.31 - 2.62 ft/sec, indicative of limited community ambulator           Pertinent Vitals/Pain Pain Assessment Pain Assessment: No/denies pain    Home Living Family/patient expects to be discharged to:: Private residence Living Arrangements: Alone Available Help at Discharge: Family;Available 24 hours/day Type of Home: House         Home Layout: Able to live on main level with bedroom/bathroom Home Equipment: Rolling Walker (2 wheels);Cane - single point;Wheelchair - manual;Grab bars - toilet;Grab bars - tub/shower;Shower seat;Cane - quad      Prior Function Prior Level of Function : Independent/Modified Independent             Mobility Comments: Pt takes quad cane with her for safety, but doesn't use it. ADLs Comments: Independent     Extremity/Trunk Assessment        Lower Extremity Assessment Lower Extremity Assessment: Overall WFL for tasks assessed       Communication   Communication Communication: No apparent difficulties    Cognition Arousal: Alert Behavior During Therapy: WFL for tasks assessed/performed   PT - Cognitive impairments: No apparent impairments  Following commands: Intact       Cueing Cueing Techniques: Verbal cues     General Comments      Exercises     Assessment/Plan    PT Assessment Patient does not need any further PT services  PT Problem List         PT Treatment Interventions      PT Goals (Current goals can be found in the Care Plan section)  Acute Rehab PT Goals Patient Stated Goal: To go  home PT Goal Formulation: With patient Time For Goal Achievement: 02/27/24 Potential to Achieve Goals: Good            AM-PAC PT 6 Clicks Mobility  Outcome Measure Help needed turning from your back to your side while in a flat bed without using bedrails?: None Help needed moving from lying on your back to sitting on the side of a flat bed without using bedrails?: None Help needed moving to and from a bed to a chair (including a wheelchair)?: None Help needed standing up from a chair using your arms (e.g., wheelchair or bedside chair)?: None Help needed to walk in hospital room?: A Little Help needed climbing 3-5 steps with a railing? : A Little 6 Click Score: 22    End of Session Equipment Utilized During Treatment: Gait belt Activity Tolerance: Patient tolerated treatment well Patient left: in chair;with family/visitor present;Other (comment) (Lab behind therapist waiting to treat.)   PT Visit Diagnosis: Muscle weakness (generalized) (M62.81)    Time: 9169-9154 PT Time Calculation (min) (ACUTE ONLY): 15 min   Charges:   PT Evaluation $PT Eval Low Complexity: 1 Low   PT General Charges $$ ACUTE PT VISIT: 1 Visit       Montie Metro, PT CLT [phone]  02/26/2024, 9:12 AM

## 2024-02-26 NOTE — Care Management CC44 (Signed)
 Condition Code 44 Documentation Completed  Patient Details  Name: [name] MRN: [id number] Date of Birth: 1935-01-09   Condition Code 44 given:  Yes Patient signature on Condition Code 44 notice:  Yes Documentation of 2 MD's agreement:  Yes Code 44 added to claim:  Yes    Mcarthur Saddie Kim, LCSW 02/26/2024, 2:44 PM

## 2024-02-26 NOTE — Discharge Summary (Addendum)
 Physician Discharge Summary   Patient: [name] MRN: [id number] DOB: 10-28-1934  Admit date:     02/25/2024  Discharge date: 02/26/24  Discharge Physician: Alm Cheyla Duchemin   PCP: Sheryle Carwin, MD   Recommendations at discharge:   Please follow up with primary care provider within 1-2 weeks  Please repeat BMP and CBC in one week    Hospital Course: 89 year old female with a history of hypertension, hyperlipidemia, SVT, coronary disease presenting with acute onset of vomiting, diarrhea, generalized weakness, dizziness.  Patient suddenly got up from the dining table while having dinner with daughter and rushed to the bathroom due to nausea (no vomiting) and this was followed by several episodes of watery diarrhea with subsequent weakness and confusion.   History supplemented by the patient's daughter at the bedside.  Patient is a difficult historian.  Apparently, the patient has had some generalized weakness and nausea, and feeling tired since 02/23/2024.  There is not been any fevers, chills, chest pain, shortness breath, hematochezia, melena.  Patient denies any headache or neck pain.  Family checked home COVID test on 02/23/2024 and it was negative. Patient has not been started on any new medications.  In the ED, patient was afebrile and hemodynamically stable with oxygen saturation 99% on room air.  WBC 8.0, hemoglobin 13.9, platelets 257.  Sodium 135, potassium 3.3, bicarbonate 19, serum creatinine 0.93.  LFTs unremarkable.  C. difficile was negative.  EKG showed sinus rhythm with nonspecific T wave changes.  CT of the brain was negative.  Troponin 6>> 5.  Patient was started on IV fluids.  UA showed 21-50 WBC   Assessment and Plan: Diarrhea -C. difficile negative - Stool pathogen panel neg - improved--no BMs since arrival to medical floor  Acute metabolic encephalopathy - Patient not at baseline, remains somewhat confused but improved from 9/3 - Daughter at bedside feels this is  from insomnia, wants to go home 02/26/2024 - UA 21-50WBC - Folic acid  >20 - B12--4066 - TSH--1.855 - would recommend outpt neuropsych testing once she is stable  Elevated lipase - Lipase 61 - Not clinically significant - No abdominal pain - tolerating diet  Essential hypertension - Holding valsartan  temporarily -restart after d/c  Dehydration - Given additional bolus of LR and 425  Generalized weakness - Workup as above - PT evaluation  Hypokalemia - Repleted - Check magnesium  Pyuria -culture pending at time of dc -d/c home with cephalexin  x 5 days       Consultants: none Procedures performed: none  Disposition: Home Diet recommendation:  Regular diet DISCHARGE MEDICATION: Allergies as of 02/26/2024       Reactions   Glucose Other (See Comments)   Unknown    Morphine  Nausea And Vomiting   Headache   Sulfonamide Derivatives Other (See Comments)   Vancomycin Other (See Comments)   Unknown    Wheat Swelling   Swelling (eyes), sore spots in head   Penicillins Rash        Medication List     TAKE these medications    aspirin EC 81 MG tablet Take 81 mg by mouth daily. Swallow whole.   CALCIUM  CARBONATE PO Take 1 tablet by mouth daily.   multivitamin tablet Take 1 tablet by mouth daily.   valsartan  40 MG tablet Commonly known as: Diovan  Take 0.5 tablets (20 mg total) by mouth daily.   Vitamin D3 125 MCG (5000 UT) Caps Take 1 capsule by mouth daily.   VITAMIN K2 PO Take 1 tablet  by mouth daily.        Discharge Exam: Filed Weights   02/25/24 1932 02/26/24 0132  Weight: 50.8 kg 51.7 kg   HEENT:  [place]/AT, No thrush, no icterus CV:  RRR, no rub, no S3, no S4 Lung:  CTA, no wheeze, no rhonchi Abd:  soft/+BS, NT Ext:  No edema, no lymphangitis, no synovitis, no rash Neuro:  CN II-XII intact, strength 4/5 in RUE, RLE, strength 4/5 LUE, LLE; sensation intact bilateral; no dysmetria; babinski equivocal   Condition at discharge:  stable  The results of significant diagnostics from this hospitalization (including imaging, microbiology, ancillary and laboratory) are listed below for reference.   Imaging Studies: CT Head Wo Contrast Result Date: 02/25/2024 EXAM: CT HEAD WITHOUT CONTRAST 02/25/2024 10:29:03 PM TECHNIQUE: CT of the head was performed without the administration of intravenous contrast. Automated exposure control, iterative reconstruction, and/or weight based adjustment of the mA/kV was utilized to reduce the radiation dose to as low as reasonably achievable. COMPARISON: 12/09/2023 CLINICAL HISTORY: Mental status change, unknown cause. Rcems from home when patient started suddenly vomiting and having diarrhea tonight. Did not take bp meds this am. 150/88 87hr rr12 o2 97% ra. Slightly altered at baseline. FINDINGS: BRAIN AND VENTRICLES: No acute hemorrhage. No evidence of acute infarct. No hydrocephalus. No extra-axial collection. No mass effect or midline shift. Chronic ischemic white matter changes and mild volume loss. ORBITS: No acute abnormality. SINUSES: No acute abnormality. SOFT TISSUES AND SKULL: No acute soft tissue abnormality. No skull fracture. IMPRESSION: 1. No acute intracranial abnormality. 2. Chronic ischemic white matter changes and mild volume loss. Electronically signed by: Franky Stanford MD 02/25/2024 10:53 PM EDT RP Workstation: [id number]    Microbiology: Results for orders placed or performed during the hospital encounter of 02/25/24  Resp panel by RT-PCR (RSV, Flu A&B, Covid) Anterior Nasal Swab     Status: None   Collection Time: 02/25/24 10:25 PM   Specimen: Anterior Nasal Swab  Result Value Ref Range Status   SARS Coronavirus 2 by RT PCR NEGATIVE NEGATIVE Final    Comment: (NOTE) SARS-CoV-2 target nucleic acids are NOT DETECTED.  The SARS-CoV-2 RNA is generally detectable in upper respiratory specimens during the acute phase of infection. The lowest concentration of SARS-CoV-2 viral  copies this assay can detect is 138 copies/mL. A negative result does not preclude SARS-Cov-2 infection and should not be used as the sole basis for treatment or other patient management decisions. A negative result may occur with  improper specimen collection/handling, submission of specimen other than nasopharyngeal swab, presence of viral mutation(s) within the areas targeted by this assay, and inadequate number of viral copies(<138 copies/mL). A negative result must be combined with clinical observations, patient history, and epidemiological information. The expected result is Negative.  Fact Sheet for Patients:  BloggerCourse.com  Fact Sheet for Healthcare Providers:  SeriousBroker.it  This test is no t yet approved or cleared by the [place]  FDA and  has been authorized for detection and/or diagnosis of SARS-CoV-2 by FDA under an Emergency Use Authorization (EUA). This EUA will remain  in effect (meaning this test can be used) for the duration of the COVID-19 declaration under Section 564(b)(1) of the Act, 21 U.S.C.section 360bbb-3(b)(1), unless the authorization is terminated  or revoked sooner.       Influenza A by PCR NEGATIVE NEGATIVE Final   Influenza B by PCR NEGATIVE NEGATIVE Final    Comment: (NOTE) The Xpert Xpress SARS-CoV-2/FLU/RSV plus assay is intended as an aid  in the diagnosis of influenza from Nasopharyngeal swab specimens and should not be used as a sole basis for treatment. Nasal washings and aspirates are unacceptable for Xpert Xpress SARS-CoV-2/FLU/RSV testing.  Fact Sheet for Patients: BloggerCourse.com  Fact Sheet for Healthcare Providers: SeriousBroker.it  This test is not yet approved or cleared by the [place]  FDA and has been authorized for detection and/or diagnosis of SARS-CoV-2 by FDA under an Emergency Use Authorization (EUA). This  EUA will remain in effect (meaning this test can be used) for the duration of the COVID-19 declaration under Section 564(b)(1) of the Act, 21 U.S.C. section 360bbb-3(b)(1), unless the authorization is terminated or revoked.     Resp Syncytial Virus by PCR NEGATIVE NEGATIVE Final    Comment: (NOTE) Fact Sheet for Patients: BloggerCourse.com  Fact Sheet for Healthcare Providers: SeriousBroker.it  This test is not yet approved or cleared by the [place]  FDA and has been authorized for detection and/or diagnosis of SARS-CoV-2 by FDA under an Emergency Use Authorization (EUA). This EUA will remain in effect (meaning this test can be used) for the duration of the COVID-19 declaration under Section 564(b)(1) of the Act, 21 U.S.C. section 360bbb-3(b)(1), unless the authorization is terminated or revoked.  Performed at [facility], [address], [place], [place] [address]   C Difficile Quick Screen w PCR reflex     Status: None   Collection Time: 02/25/24 11:48 PM   Specimen: STOOL  Result Value Ref Range Status   C Diff antigen NEGATIVE NEGATIVE Final   C Diff toxin NEGATIVE NEGATIVE Final   C Diff interpretation No C. difficile detected.  Final    Comment: Performed at [facility], [address], [place], [place] [address]    Labs: CBC: Recent Labs  Lab 02/25/24 1955  WBC 8.0  HGB 13.9  HCT 42.5  MCV 94.2  PLT 257   Basic Metabolic Panel: Recent Labs  Lab 02/25/24 1955  NA 135  K 3.3*  CL 101  CO2 19*  GLUCOSE 105*  BUN 18  CREATININE 0.93  CALCIUM  8.8*   Liver Function Tests: Recent Labs  Lab 02/25/24 1955  AST 27  ALT 20  ALKPHOS 79  BILITOT 0.6  PROT 7.5  ALBUMIN 3.9   CBG: No results for input(s): GLUCAP in the last 168 hours.  Discharge time spent: greater than 30 minutes.  Signed: Alm Schneider, MD [facility] 02/26/2024

## 2024-02-26 NOTE — Care Management Obs Status (Signed)
 MEDICARE OBSERVATION STATUS NOTIFICATION   Patient Details  Name: [name] MRN: [id number] Date of Birth: June 29, 1934   Medicare Observation Status Notification Given:  Yes    Mcarthur Saddie Kim, LCSW 02/26/2024, 2:44 PM

## 2024-02-26 NOTE — Hospital Course (Signed)
 89 year old female with a history of hypertension, hyperlipidemia, SVT, coronary disease presenting with acute onset of vomiting, diarrhea, generalized weakness, dizziness.  Patient suddenly got up from the dining table while having dinner with daughter and rushed to the bathroom due to nausea (no vomiting) and this was followed by several episodes of watery diarrhea with subsequent weakness and confusion.   History supplemented by the patient's daughter at the bedside.  Patient is a difficult historian.  Apparently, the patient has had some generalized weakness and nausea, and feeling tired since 02/23/2024.  There is not been any fevers, chills, chest pain, shortness breath, hematochezia, melena.  Patient denies any headache or neck pain.  Family checked home COVID test on 02/23/2024 and it was negative. Patient has not been started on any new medications.  In the ED, patient was afebrile and hemodynamically stable with oxygen saturation 99% on room air.  WBC 8.0, hemoglobin 13.9, platelets 257.  Sodium 135, potassium 3.3, bicarbonate 19, serum creatinine 0.93.  LFTs unremarkable.  C. difficile was negative.  EKG showed sinus rhythm with nonspecific T wave changes.  CT of the brain was negative.  Troponin 6>> 5.  Patient was started on IV fluids.  UA showed 21-50 WBC

## 2024-02-26 NOTE — H&P (Addendum)
 History and Physical    Patient: [name] FMW:[id number] DOB: 03-Jan-1935 DOA: 02/25/2024 DOS: the patient was seen and examined on 02/26/2024 PCP: Sheryle Carwin, MD  Patient coming from: Home  Chief Complaint:  Chief Complaint  Patient presents with   Emesis   HPI: [name] is a 89 y.o. female with medical history significant of hypertension, osteopenia who presents to the emergency department from home via EMS due to sudden onset of nausea and diarrhea which started while having dinner yesterday.  Patient was unable to provide history due to underlying short-term memory problems, most of the history was obtained from daughter at bedside.  Per report, patient suddenly got up from the dining table while having dinner with daughter and rushed to the bathroom due to nausea (no vomiting) and this was followed by several episodes of watery diarrhea with subsequent weakness and confusion.  She was pale and complained of lightheadedness, but there was no syncope.  COVID and flu test done over the weekend was negative.  ED Course:  In the emergency department, BP was 152/84, other vital signs were within normal range.  Workup in the ED showed a normal CBC.  BMP showed sodium 135, potassium 3.3, chloride 101, bicarb 19, blood glucose 105, BUN 18, creatinine 0.93, calcium  8.8.  Lipase 61, troponin 6.  Influenza A, B, SARS coronavirus 2, RSV was negative. CT head without contrast showed no acute intracranial abnormality. Bentyl , Zofran  were given, IV NS 1 L was provided.  TRH was asked to admit patient  Review of Systems: Review of systems as noted in the HPI. All other systems reviewed and are negative.   Past Medical History:  Diagnosis Date   Basal cell carcinoma    Chest pain    Diverticulosis    DJD (degenerative joint disease) of cervical spine    GERD (gastroesophageal reflux disease)    Head injury, closed    Headache(784.0)    Osteopenia    Pneumothorax 2006    PSVT (paroxysmal supraventricular tachycardia) (HCC) 2003   Past Surgical History:  Procedure Laterality Date   ABDOMINAL HYSTERECTOMY     APPENDECTOMY     COLONOSCOPY     NISSEN FUNDOPLICATION     TONSILLECTOMY      Social History:  reports that she has never smoked. She has never used smokeless tobacco. She reports that she does not drink alcohol and does not use drugs.   Allergies  Allergen Reactions   Glucose Other (See Comments)    Unknown    Morphine  Nausea And Vomiting    Headache   Sulfonamide Derivatives Other (See Comments)   Vancomycin Other (See Comments)    Unknown    Wheat Swelling    Swelling (eyes), sore spots in head   Penicillins Rash    Family History  Problem Relation Age of Onset   Arthritis Mother    Vasculitis Mother    Heart disease Father    Parkinsonism Father    Arthritis Sister    Kidney failure Brother    Asthma Sister    Heart disease Sister      Prior to Admission medications   Medication Sig Start Date End Date Taking? Authorizing Provider  aspirin EC 81 MG tablet Take 81 mg by mouth daily. Swallow whole.    [provider]  Calcium  Carbonate Antacid (CALCIUM  CARBONATE PO) Take 1 tablet by mouth daily.    [provider]  Cholecalciferol (VITAMIN D3) 125 MCG (5000 UT) CAPS  Take 1 capsule by mouth daily.    [provider]  Menaquinone-7 (VITAMIN K2 PO) Take 1 tablet by mouth daily.    [provider]  Multiple Vitamin (MULTIVITAMIN) tablet Take 1 tablet by mouth daily.    [provider]  valsartan  (DIOVAN ) 40 MG tablet Take 0.5 tablets (20 mg total) by mouth daily. 12/24/23 12/23/24  Johnson Laymon HERO, PA-C    Physical Exam: BP (!) 155/88 (BP Location: Right Arm)   Pulse 65   Temp 98.1 F (36.7 C) (Oral)   Resp 16   Ht 5' 1 (1.549 m)   Wt 51.7 kg   SpO2 99%   BMI 21.54 kg/m   General: 89 y.o. year-old female well developed well nourished in no acute distress.  Alert and oriented  x3. HEENT: NCAT, EOMI, dry mucous membrane Neck: Supple, trachea medial Cardiovascular: Regular rate and rhythm with no rubs or gallops.  No thyromegaly or JVD noted.  No lower extremity edema. 2/4 pulses in all 4 extremities. Respiratory: Clear to auscultation with no wheezes or rales. Good inspiratory effort. Abdomen: Soft, nontender nondistended with normal bowel sounds x4 quadrants. Muskuloskeletal: No cyanosis, clubbing or edema noted bilaterally Neuro: CN II-XII intact, strength 5/5 x 4, sensation, reflexes intact Skin: Decreased skin turgor.  No ulcerative lesions noted or rashes Psychiatry: Judgement and insight appear normal. Mood is appropriate for condition and setting          Labs on Admission:  Basic Metabolic Panel: Recent Labs  Lab 02/25/24 1955  NA 135  K 3.3*  CL 101  CO2 19*  GLUCOSE 105*  BUN 18  CREATININE 0.93  CALCIUM  8.8*   Liver Function Tests: Recent Labs  Lab 02/25/24 1955  AST 27  ALT 20  ALKPHOS 79  BILITOT 0.6  PROT 7.5  ALBUMIN 3.9   Recent Labs  Lab 02/25/24 1955  LIPASE 61*   No results for input(s): AMMONIA in the last 168 hours. CBC: Recent Labs  Lab 02/25/24 1955  WBC 8.0  HGB 13.9  HCT 42.5  MCV 94.2  PLT 257   Cardiac Enzymes: No results for input(s): CKTOTAL, CKMB, CKMBINDEX, TROPONINI in the last 168 hours.  BNP (last 3 results) No results for input(s): BNP in the last 8760 hours.  ProBNP (last 3 results) No results for input(s): PROBNP in the last 8760 hours.  CBG: No results for input(s): GLUCAP in the last 168 hours.  Radiological Exams on Admission: CT Head Wo Contrast Result Date: 02/25/2024 EXAM: CT HEAD WITHOUT CONTRAST 02/25/2024 10:29:03 PM TECHNIQUE: CT of the head was performed without the administration of intravenous contrast. Automated exposure control, iterative reconstruction, and/or weight based adjustment of the mA/kV was utilized to reduce the radiation dose to as low as  reasonably achievable. COMPARISON: 12/09/2023 CLINICAL HISTORY: Mental status change, unknown cause. Rcems from home when patient started suddenly vomiting and having diarrhea tonight. Did not take bp meds this am. 150/88 87hr rr12 o2 97% ra. Slightly altered at baseline. FINDINGS: BRAIN AND VENTRICLES: No acute hemorrhage. No evidence of acute infarct. No hydrocephalus. No extra-axial collection. No mass effect or midline shift. Chronic ischemic white matter changes and mild volume loss. ORBITS: No acute abnormality. SINUSES: No acute abnormality. SOFT TISSUES AND SKULL: No acute soft tissue abnormality. No skull fracture. IMPRESSION: 1. No acute intracranial abnormality. 2. Chronic ischemic white matter changes and mild volume loss. Electronically signed by: Franky Stanford MD 02/25/2024 10:53 PM EDT RP Workstation: [id number]  EKG: I independently viewed the EKG done and my findings are as followed: Normal sinus rhythm at a rate of 79 bpm  Assessment/Plan Present on Admission:  Essential hypertension  Principal Problem:   Intractable nausea Active Problems:   Essential hypertension   Acute diarrhea   Generalized weakness   Dehydration   Hypokalemia   Elevated lipase   Osteopenia  Intractable nausea Continue IV Zofran  p.r.n. Continue IV hydration  Acute diarrhea C. difficile was negative GI stool panel pending Monitor for electrolyte abnormality and replenish  Generalized weakness in the setting of above 2 Continue IV hydration Replenish abnormal electrolytes Continue fall precaution Continue PT eval and treat  Dehydration Continue IV hydration  Hypokalemia K+ 3.3, this will be replenished  Elevated lipase Lipase 61,Patient denies alcohol use and any abdominal pain  Essential hypertension Continue Avapro   Osteopenia Continue cholecalciferol, Os-Cal  DVT prophylaxis: Lovenox   Code Status: Full code  Family Communication: Daughter at bedside (all questions answered  to satisfaction)  Consults: None  Severity of Illness: The appropriate patient status for this patient is INPATIENT. Inpatient status is judged to be reasonable and necessary in order to provide the required intensity of service to ensure the patient's safety. The patient's presenting symptoms, physical exam findings, and initial radiographic and laboratory data in the context of their chronic comorbidities is felt to place them at high risk for further clinical deterioration. Furthermore, it is not anticipated that the patient will be medically stable for discharge from the hospital within 2 midnights of admission.   * I certify that at the point of admission it is my clinical judgment that the patient will require inpatient hospital care spanning beyond 2 midnights from the point of admission due to high intensity of service, high risk for further deterioration and high frequency of surveillance required.*  Author: Ainsley Deakins, DO 02/26/2024 3:05 AM  For on call review [email or web address].

## 2024-02-26 NOTE — ED Provider Notes (Signed)
 Care assumed at shift change pending Trop and admission for diarrhea and metabolic encephalopathy.  Physical Exam  BP (!) 141/73   Pulse 69   Temp 97.9 F (36.6 C) (Oral)   Resp 17   Ht 4' 11 (1.499 m)   Wt 50.8 kg   SpO2 100%   BMI 22.62 kg/m   Physical Exam  Procedures  Procedures  ED Course / MDM   Clinical Course as of 02/26/24 0212  Thu Feb 26, 2024  0011 Trop is normal. Hospitalist paged for admission.  [CS]  [phone] Spoke with Dr. Manfred, Hospitalist, who will evaluate for admission.  [CS]    Clinical Course User Index [CS] Roselyn Carlin NOVAK, MD   Medical Decision Making Amount and/or Complexity of Data Reviewed Labs: ordered. Radiology: ordered.  Risk Prescription drug management. Decision regarding hospitalization.          Roselyn Carlin NOVAK, MD 02/26/24 [phone]

## 2024-02-26 NOTE — Progress Notes (Signed)
 Transition of Care Department [facility]) has reviewed patient and no other TOC needs have been identified at this time. We will continue to monitor patient advancement through interdisciplinary progression rounds. If new patient transition needs arise, please place a TOC consult.   02/26/24 0918  TOC Brief Assessment  Insurance and Status Reviewed  Patient has primary care physician Yes  Home environment has been reviewed Lives alone. Daughter nearby.  Prior level of function: Independent.  Prior/Current Home Services No current home services  Social Drivers of Health Review SDOH reviewed no interventions necessary  Readmission risk has been reviewed Yes  Transition of care needs no transition of care needs at this time

## 2024-02-28 LAB — URINE CULTURE: Culture: 10000 — AB

## 2024-03-04 DIAGNOSIS — E876 Hypokalemia: Secondary | ICD-10-CM | POA: Diagnosis not present

## 2024-03-04 DIAGNOSIS — G9341 Metabolic encephalopathy: Secondary | ICD-10-CM | POA: Diagnosis not present

## 2024-06-01 ENCOUNTER — Ambulatory Visit: Admitting: Internal Medicine

## 2024-06-02 ENCOUNTER — Ambulatory Visit: Attending: Internal Medicine | Admitting: Internal Medicine

## 2024-06-02 ENCOUNTER — Encounter: Payer: Self-pay | Admitting: Internal Medicine

## 2024-06-02 VITALS — BP 138/86 | HR 91 | Ht 60.0 in | Wt 111.0 lb

## 2024-06-02 DIAGNOSIS — E785 Hyperlipidemia, unspecified: Secondary | ICD-10-CM | POA: Diagnosis not present

## 2024-06-02 DIAGNOSIS — I471 Supraventricular tachycardia, unspecified: Secondary | ICD-10-CM | POA: Insufficient documentation

## 2024-06-02 DIAGNOSIS — I2510 Atherosclerotic heart disease of native coronary artery without angina pectoris: Secondary | ICD-10-CM

## 2024-06-02 DIAGNOSIS — I6529 Occlusion and stenosis of unspecified carotid artery: Secondary | ICD-10-CM | POA: Insufficient documentation

## 2024-06-02 DIAGNOSIS — R931 Abnormal findings on diagnostic imaging of heart and coronary circulation: Secondary | ICD-10-CM | POA: Insufficient documentation

## 2024-06-02 NOTE — Patient Instructions (Signed)
 Medication Instructions:  Your physician recommends that you continue on your current medications as directed. Please refer to the Current Medication list given to you today.  *If you need a refill on your cardiac medications before your next appointment, please call your pharmacy*  Lab Work: Fasting Lipid Panel  If you have labs (blood work) drawn today and your tests are completely normal, you will receive your results only by: MyChart Message (if you have MyChart) OR A paper copy in the mail If you have any lab test that is abnormal or we need to change your treatment, we will call you to review the results.  Testing/Procedures: Your physician has requested that you have an echocardiogram. Echocardiography is a painless test that uses sound waves to create images of your heart. It provides your doctor with information about the size and shape of your heart and how well your hearts chambers and valves are working. This procedure takes approximately one hour. There are no restrictions for this procedure. Please do NOT wear cologne, perfume, aftershave, or lotions (deodorant is allowed). Please arrive 15 minutes prior to your appointment time.  Please note: We ask at that you not bring children with you during ultrasound (echo/ vascular) testing. Due to room size and safety concerns, children are not allowed in the ultrasound rooms during exams. Our front office staff cannot provide observation of children in our lobby area while testing is being conducted. An adult accompanying a patient to their appointment will only be allowed in the ultrasound room at the discretion of the ultrasound technician under special circumstances. We apologize for any inconvenience.  Your physician has requested that you have a carotid duplex. This test is an ultrasound of the carotid arteries in your neck. It looks at blood flow through these arteries that supply the brain with blood. Allow one hour for this exam.  There are no restrictions or special instructions.   Follow-Up: At [facility], you and your health needs are our priority.  As part of our continuing mission to provide you with exceptional heart care, our providers are all part of one team.  This team includes your primary Cardiologist (physician) and Advanced Practice Providers or APPs (Physician Assistants and Nurse Practitioners) who all work together to provide you with the care you need, when you need it.  Your next appointment:   1 year(s)  Provider:   You may see Vishnu Mallipeddi, MD or one of the following Advanced Practice Providers on your designated Care Team:   Brittany Strader, PA-C  Scotesia Arnold, [place] Olivia Pavy, [place]     We recommend signing up for the patient portal called MyChart.  Sign up information is provided on this After Visit Summary.  MyChart is used to connect with patients for Virtual Visits (Telemedicine).  Patients are able to view lab/test results, encounter notes, upcoming appointments, etc.  Non-urgent messages can be sent to your provider as well.   To learn more about what you can do with MyChart, go to [email or web address].   Other Instructions

## 2024-06-02 NOTE — Progress Notes (Signed)
 Cardiology Office Note  Date: 06/02/2024   ID: [name], [name] December 22, 1934, MRN [id number]  PCP:  Sheryle Carwin, MD  Cardiologist:  Aleene Passe, MD (Inactive) Electrophysiologist:  None   History of Present Illness: [name] is a 89 y.o. female known to have paroxysmal SVT, loop recorder calcium  score, left carotid artery stenosis, HTN is here today for follow-up visit.  Accompanied by daughter.  Collateral history is obtained from the daughter.  Patient gets palpitations only when she gets stressed out.  No angina or DOE.  She lives by herself and performs all the chores independently.  She has her children within a few minutes of drive.  Past Medical History:  Diagnosis Date   Basal cell carcinoma    Chest pain    Diverticulosis    DJD (degenerative joint disease) of cervical spine    GERD (gastroesophageal reflux disease)    Head injury, closed    Headache(784.0)    Osteopenia    Pneumothorax 2006   PSVT (paroxysmal supraventricular tachycardia) 2003    Past Surgical History:  Procedure Laterality Date   ABDOMINAL HYSTERECTOMY     APPENDECTOMY     COLONOSCOPY     NISSEN FUNDOPLICATION     TONSILLECTOMY      Current Outpatient Medications  Medication Sig Dispense Refill   aspirin EC 81 MG tablet Take 81 mg by mouth daily. Swallow whole.     Cholecalciferol (VITAMIN D3) 125 MCG (5000 UT) CAPS Take 1 capsule by mouth daily.     Menaquinone-7 (VITAMIN K2 PO) Take 1 tablet by mouth daily.     Multiple Vitamin (MULTIVITAMIN) tablet Take 1 tablet by mouth daily.     valsartan  (DIOVAN ) 40 MG tablet Take 0.5 tablets (20 mg total) by mouth daily. 45 tablet 3   No current facility-administered medications for this visit.   Allergies:  Glucose, Morphine , Sulfonamide derivatives, Vancomycin, Wheat, and Penicillins   Social History: The patient  reports that she has never smoked. She has never used smokeless tobacco. She reports that she does not drink  alcohol and does not use drugs.   Family History: The patient's family history includes Arthritis in her mother and sister; Asthma in her sister; Heart disease in her father and sister; Kidney failure in her brother; Parkinsonism in her father; Vasculitis in her mother.   ROS:  Please see the history of present illness. Otherwise, complete review of systems is positive for none.  All other systems are reviewed and negative.   Physical Exam: VS:  BP 138/86   Pulse 91   Ht 5' (1.524 m)   Wt 111 lb (50.3 kg)   SpO2 95%   BMI 21.68 kg/m , BMI Body mass index is 21.68 kg/m.  Wt Readings from Last 3 Encounters:  06/02/24 111 lb (50.3 kg)  02/26/24 113 lb 15.7 oz (51.7 kg)  12/24/23 112 lb (50.8 kg)    General: Patient appears comfortable at rest. HEENT: Conjunctiva and lids normal, oropharynx clear with moist mucosa. Neck: Supple, no elevated JVP or carotid bruits, no thyromegaly. Lungs: Clear to auscultation, nonlabored breathing at rest. Cardiac: Regular rate and rhythm, no S3 or significant systolic murmur, no pericardial rub. Abdomen: Soft, nontender, no hepatomegaly, bowel sounds present, no guarding or rebound. Extremities: No pitting edema, distal pulses 2+. Skin: Warm and dry. Musculoskeletal: No kyphosis. Neuropsychiatric: Alert and oriented x3, affect grossly appropriate.  Recent Labwork: 02/26/2024: ALT 18; AST 24; BUN 14; Creatinine, Ser 0.68; Hemoglobin 12.2;  Magnesium 2.2; Platelets 227; Potassium 3.8; Sodium 141; TSH 1.855     Component Value Date/Time   CHOL 173 05/06/2022 0940   TRIG 78 05/06/2022 0940   HDL 67 05/06/2022 0940   CHOLHDL 2.6 05/06/2022 0940   CHOLHDL 2.1 12/08/2020 0948   VLDL 16 12/08/2020 0948   LDLCALC 91 05/06/2022 0940     Assessment and Plan:  Paroxysmal SVT: Palpitations lasting for couple of minutes during stress.  No SVT recurrences.  Currently not on any rate controlling agents.  No echo on file.  Update echocardiogram.  No loud  murmur on exam.  Elevated coronary calcium  score 490 (76th percentile for age and sex matched control): No angina or DOE.  Continue aspirin 81 mg once daily.  Intolerant to statins and she was taken off statin by her PCP due to memory issues.  Agree.  Will obtain lipid panel.  If LDL more than 120, recommend PCSK9 inhibitors.  Previously she was on this medication but had to be discontinued due to cost.  But she is agreeable to try if needed and if the cost is not limiting.  HLD, unknown values:  Intolerant to statins and she was taken off statin by her PCP due to memory issues.  Agree.  Will obtain lipid panel.  If LDL more than 120, recommend PCSK9 inhibitors.  Previously she was on this medication but had to be discontinued due to cost.  But she is agreeable to try if needed and if the cost is not limiting.  L ICA stenosis: Heterogeneous plaque in the LICA.  50 to 60% stenosis based on peak velocity criteria however the ratio ICA/CCA suggested a lesser degree of stenosis.  Update carotid Doppler ultrasound.  Continue aspirin 81 mg once daily.  HLD management, as above.  HTN, controlled: Continue valsartan  20 mg once daily.  30-minute spent in reviewing prior medical records, more than 3 labs, discussion and documentation.   Medication Adjustments/Labs and Tests Ordered: Current medicines are reviewed at length with the patient today.  Concerns regarding medicines are outlined above.    Disposition:  Follow up 1 year  Signed, Byrant Valent Arleta Maywood, MD, 06/02/2024 11:04 AM    [facility] at [facility] [address], [place], [place] [address]

## 2024-06-03 ENCOUNTER — Other Ambulatory Visit (HOSPITAL_COMMUNITY)
Admission: RE | Admit: 2024-06-03 | Discharge: 2024-06-03 | Disposition: A | Discharge status: Home / Self Care | Source: Ambulatory Visit | Attending: Internal Medicine | Admitting: Internal Medicine

## 2024-06-03 DIAGNOSIS — I2510 Atherosclerotic heart disease of native coronary artery without angina pectoris: Secondary | ICD-10-CM | POA: Diagnosis present

## 2024-06-03 LAB — LIPID PANEL
Cholesterol: 235 mg/dL — ABNORMAL HIGH (ref 0–200)
HDL: 69 mg/dL (ref >40)
LDL Cholesterol: 142 mg/dL — ABNORMAL HIGH (ref 0–99)
Total CHOL/HDL Ratio: 3.4 ratio
Triglycerides: 123 mg/dL (ref <150)
VLDL: 25 mg/dL (ref 0–40)

## 2024-06-18 ENCOUNTER — Ambulatory Visit: Payer: Self-pay | Admitting: Internal Medicine

## 2024-06-18 MED ORDER — REPATHA SURECLICK 140 MG/ML ~~LOC~~ SOAJ: 140.0000 mg | SUBCUTANEOUS | 2 refills | Status: AC

## 2024-06-18 NOTE — Telephone Encounter (Signed)
 The patient has been notified of the result and verbalized understanding.  All questions (if any) were answered. Rosina JAYSON Cornea, CMA 06/18/2024 1:27 PM

## 2024-06-18 NOTE — Telephone Encounter (Signed)
-----   Message from Vishnu Mallipeddi, MD sent at 06/18/2024  1:11 PM EST ----- LDL 142, elevated and TG 123, normal. Start Repatha . Previously cost prohibitive. Send to prior auth team.

## 2024-07-07 ENCOUNTER — Ambulatory Visit (HOSPITAL_COMMUNITY)
Admission: RE | Admit: 2024-07-07 | Discharge: 2024-07-07 | Disposition: A | Discharge status: Home / Self Care | Source: Ambulatory Visit | Attending: Internal Medicine | Admitting: Internal Medicine

## 2024-07-07 DIAGNOSIS — I6529 Occlusion and stenosis of unspecified carotid artery: Secondary | ICD-10-CM | POA: Diagnosis present

## 2024-07-09 ENCOUNTER — Ambulatory Visit (HOSPITAL_COMMUNITY)
Admission: RE | Admit: 2024-07-09 | Discharge: 2024-07-09 | Disposition: A | Discharge status: Home / Self Care | Source: Ambulatory Visit | Attending: Internal Medicine | Admitting: Internal Medicine

## 2024-07-09 DIAGNOSIS — I10 Essential (primary) hypertension: Secondary | ICD-10-CM

## 2024-07-09 DIAGNOSIS — I517 Cardiomegaly: Secondary | ICD-10-CM | POA: Insufficient documentation

## 2024-07-09 DIAGNOSIS — I471 Supraventricular tachycardia, unspecified: Secondary | ICD-10-CM | POA: Diagnosis present

## 2024-07-09 LAB — ECHOCARDIOGRAM COMPLETE
AR max vel: 2.75 cm2
AV Area VTI: 2.91 cm2
AV Area mean vel: 2.54 cm2
AV Mean grad: 3 mmHg
AV Peak grad: 3.9 mmHg
Ao pk vel: 0.99 m/s
Area-P 1/2: 4.24 cm2
S' Lateral: 1.5 cm
